# Patient Record
Sex: Female | Born: 1990 | Hispanic: No | Marital: Married | Smoking: Never smoker
Health system: Southern US, Community
[De-identification: ages and names within clinical notes are randomized; demographics above are authoritative.]

## PROBLEM LIST (undated history)

## (undated) ENCOUNTER — Inpatient Hospital Stay (HOSPITAL_COMMUNITY): Payer: Self-pay

## (undated) DIAGNOSIS — J45909 Unspecified asthma, uncomplicated: Secondary | ICD-10-CM

## (undated) DIAGNOSIS — F419 Anxiety disorder, unspecified: Secondary | ICD-10-CM

## (undated) DIAGNOSIS — R519 Headache, unspecified: Secondary | ICD-10-CM

## (undated) DIAGNOSIS — D649 Anemia, unspecified: Secondary | ICD-10-CM

## (undated) DIAGNOSIS — Z8619 Personal history of other infectious and parasitic diseases: Secondary | ICD-10-CM

## (undated) HISTORY — DX: Anxiety disorder, unspecified: F41.9

## (undated) HISTORY — DX: Headache, unspecified: R51.9

## (undated) HISTORY — DX: Anemia, unspecified: D64.9

## (undated) HISTORY — PX: UMBILICAL HERNIA REPAIR: SHX196

## (undated) HISTORY — DX: Personal history of other infectious and parasitic diseases: Z86.19

## (undated) HISTORY — PX: APPENDECTOMY: SHX54

## (undated) HISTORY — PX: TONSILLECTOMY: SUR1361

## (undated) HISTORY — PX: KNEE SURGERY: SHX244

## (undated) HISTORY — DX: Unspecified asthma, uncomplicated: J45.909

---

## 2014-11-02 ENCOUNTER — Ambulatory Visit (INDEPENDENT_AMBULATORY_CARE_PROVIDER_SITE_OTHER): Payer: BLUE CROSS/BLUE SHIELD | Admitting: Obstetrics & Gynecology

## 2014-11-02 ENCOUNTER — Encounter: Payer: Self-pay | Admitting: Obstetrics & Gynecology

## 2014-11-02 VITALS — BP 109/64 | HR 72 | Resp 16 | Ht 66.0 in | Wt 169.0 lb

## 2014-11-02 DIAGNOSIS — G43909 Migraine, unspecified, not intractable, without status migrainosus: Secondary | ICD-10-CM | POA: Insufficient documentation

## 2014-11-02 DIAGNOSIS — G43009 Migraine without aura, not intractable, without status migrainosus: Secondary | ICD-10-CM

## 2014-11-02 DIAGNOSIS — N8320 Unspecified ovarian cysts: Secondary | ICD-10-CM

## 2014-11-02 DIAGNOSIS — N83202 Unspecified ovarian cyst, left side: Secondary | ICD-10-CM

## 2014-11-02 DIAGNOSIS — Z30011 Encounter for initial prescription of contraceptive pills: Secondary | ICD-10-CM

## 2014-11-02 NOTE — Progress Notes (Signed)
Pt is a 24 yo G1P1001 who presents for f/u from ED visit [facility].  Pt had left lower quadrant pain that was not getting better.  She suspected it had something to do with ovulation but pain was more intense than normal.  She can tell which side she develops a follicle on and has mittleschmerz. US was nml with trace ff in the cul de sac and an involuting corpus luteum.  Pt was unaware these findings were normal at the time of her ED visit so she is relieved today to find out her US is nml.  We had an extensive conversation about birth control choices.  WE decided that OCPs would be best to control ovulation and if she did continuous, then this could also help her migraine headaches.  Past Medical History  Diagnosis Date  . Asthma    Family History  Problem Relation Age of Onset  . Hypertension Mother   . Hypertension Father   . Hypertension Maternal Grandmother   . Hypertension Maternal Grandfather   . Hypertension Paternal Grandmother   . Hypertension Paternal Grandfather   . Diabetes Maternal Grandfather   . Cancer - Lung Maternal Grandfather   . Anemia Mother    Pt's history negative for contraindication to OCPs.  Pt would like to discuss with her husband and return for IUD removal and start OCPs. Pt has been on Sprintec in the past and had good cycle control.  30 minutes spent face to face with patient and >50% was counseling.  Onna Nodal H.

## 2015-10-24 NOTE — L&D Delivery Note (Signed)
Delivery Note At 8:18 PM a viable and healthy female was delivered via Vaginal, Spontaneous Delivery (Presentation:ROA ;  ).  APGAR: 9, 10; weight  .   Placenta status: spontaneous, intact not sent, .  Cord:  with the following complications: . Loop next to chest Cord pH: none  Anesthesia:  epidural Episiotomy: None Lacerations: None Suture Repair: none Est. Blood Loss (mL): 150  Mom to postpartum.  Baby to Couplet care / Skin to Skin.  Jj Enyeart A 06/01/2016, 8:39 PM

## 2015-11-12 LAB — OB RESULTS CONSOLE RUBELLA ANTIBODY, IGM: Rubella: IMMUNE

## 2015-11-12 LAB — OB RESULTS CONSOLE ABO/RH: RH Type: POSITIVE

## 2015-11-12 LAB — OB RESULTS CONSOLE RPR: RPR: NONREACTIVE

## 2015-11-12 LAB — OB RESULTS CONSOLE ANTIBODY SCREEN: Antibody Screen: NEGATIVE

## 2015-11-12 LAB — OB RESULTS CONSOLE HIV ANTIBODY (ROUTINE TESTING): HIV: NONREACTIVE

## 2015-11-12 LAB — OB RESULTS CONSOLE GC/CHLAMYDIA
Chlamydia: NEGATIVE
Gonorrhea: NEGATIVE

## 2015-11-12 LAB — OB RESULTS CONSOLE HEPATITIS B SURFACE ANTIGEN: Hepatitis B Surface Ag: NEGATIVE

## 2016-05-09 LAB — OB RESULTS CONSOLE GBS: GBS: POSITIVE

## 2016-05-24 ENCOUNTER — Other Ambulatory Visit: Payer: Self-pay | Admitting: Obstetrics and Gynecology

## 2016-05-28 ENCOUNTER — Encounter (HOSPITAL_COMMUNITY): Payer: Self-pay | Admitting: *Deleted

## 2016-05-28 ENCOUNTER — Inpatient Hospital Stay (HOSPITAL_COMMUNITY)
Admission: AD | Admit: 2016-05-28 | Discharge: 2016-05-28 | Disposition: A | Discharge status: Home / Self Care | Payer: 59 | Source: Ambulatory Visit | Attending: Obstetrics and Gynecology | Admitting: Obstetrics and Gynecology

## 2016-05-28 DIAGNOSIS — Z3A38 38 weeks gestation of pregnancy: Secondary | ICD-10-CM | POA: Insufficient documentation

## 2016-05-28 NOTE — Discharge Instructions (Signed)
Braxton Hicks Contractions °Contractions of the uterus can occur throughout pregnancy. Contractions are not always a sign that you are in labor.  °WHAT ARE BRAXTON HICKS CONTRACTIONS?  °Contractions that occur before labor are called Braxton Hicks contractions, or false labor. Toward the end of pregnancy (32-34 weeks), these contractions can develop more often and may become more forceful. This is not true labor because these contractions do not result in opening (dilatation) and thinning of the cervix. They are sometimes difficult to tell apart from true labor because these contractions can be forceful and people have different pain tolerances. You should not feel embarrassed if you go to the hospital with false labor. Sometimes, the only way to tell if you are in true labor is for your health care provider to look for changes in the cervix. °If there are no prenatal problems or other health problems associated with the pregnancy, it is completely safe to be sent home with false labor and await the onset of true labor. °HOW CAN YOU TELL THE DIFFERENCE BETWEEN TRUE AND FALSE LABOR? °False Labor °· The contractions of false labor are usually shorter and not as hard as those of true labor.   °· The contractions are usually irregular.   °· The contractions are often felt in the front of the lower abdomen and in the groin.   °· The contractions may go away when you walk around or change positions while lying down.   °· The contractions get weaker and are shorter lasting as time goes on.   °· The contractions do not usually become progressively stronger, regular, and closer together as with true labor.   °True Labor °· Contractions in true labor last 30-70 seconds, become very regular, usually become more intense, and increase in frequency.   °· The contractions do not go away with walking.   °· The discomfort is usually felt in the top of the uterus and spreads to the lower abdomen and low back.   °· True labor can be  determined by your health care provider with an exam. This will show that the cervix is dilating and getting thinner.   °WHAT TO REMEMBER °· Keep up with your usual exercises and follow other instructions given by your health care provider.   °· Take medicines as directed by your health care provider.   °· Keep your regular prenatal appointments.   °· Eat and drink lightly if you think you are going into labor.   °· If Braxton Hicks contractions are making you uncomfortable:   °¨ Change your position from lying down or resting to walking, or from walking to resting.   °¨ Sit and rest in a tub of warm water.   °¨ Drink 2-3 glasses of water. Dehydration may cause these contractions.   °¨ Do slow and deep breathing several times an hour.   °WHEN SHOULD I SEEK IMMEDIATE MEDICAL CARE? °Seek immediate medical care if: °· Your contractions become stronger, more regular, and closer together.   °· You have fluid leaking or gushing from your vagina.   °· You have a fever.   °· You pass blood-tinged mucus.   °· You have vaginal bleeding.   °· You have continuous abdominal pain.   °· You have low back pain that you never had before.   °· You feel your baby's head pushing down and causing pelvic pressure.   °· Your baby is not moving as much as it used to.   °  °This information is not intended to replace advice given to you by your health care provider. Make sure you discuss any questions you have with your health care   provider. °  °Document Released: 10/09/2005 Document Revised: 10/14/2013 Document Reviewed: 07/21/2013 °Elsevier Interactive Patient Education ©2016 [employer] °Fetal Movement Counts °Patient Name: __________________________________________________ Patient Due Date: ____________________ °Performing a fetal movement count is highly recommended in high-risk pregnancies, but it is good for every pregnant woman to do. Your health care provider may ask you to start counting fetal movements at 28 weeks of the  pregnancy. Fetal movements often increase: °· After eating a full meal. °· After physical activity. °· After eating or drinking something sweet or cold. °· At rest. °Pay attention to when you feel the baby is most active. This will help you notice a pattern of your baby's sleep and wake cycles and what factors contribute to an increase in fetal movement. It is important to perform a fetal movement count at the same time each day when your baby is normally most active.  °HOW TO COUNT FETAL MOVEMENTS °1. Find a quiet and comfortable area to sit or lie down on your left side. Lying on your left side provides the best blood and oxygen circulation to your baby. °2. Write down the day and time on a sheet of paper or in a journal. °3. Start counting kicks, flutters, swishes, rolls, or jabs in a 2-hour period. You should feel at least 10 movements within 2 hours. °4. If you do not feel 10 movements in 2 hours, wait 2-3 hours and count again. Look for a change in the pattern or not enough counts in 2 hours. °SEEK MEDICAL CARE IF: °· You feel less than 10 counts in 2 hours, tried twice. °· There is no movement in over an hour. °· The pattern is changing or taking longer each day to reach 10 counts in 2 hours. °· You feel the baby is not moving as he or she usually does. °Date: ____________ Movements: ____________ Start time: ____________ Finish time: ____________  °Date: ____________ Movements: ____________ Start time: ____________ Finish time: ____________ °Date: ____________ Movements: ____________ Start time: ____________ Finish time: ____________ °Date: ____________ Movements: ____________ Start time: ____________ Finish time: ____________ °Date: ____________ Movements: ____________ Start time: ____________ Finish time: ____________ °Date: ____________ Movements: ____________ Start time: ____________ Finish time: ____________ °Date: ____________ Movements: ____________ Start time: ____________ Finish time:  ____________ °Date: ____________ Movements: ____________ Start time: ____________ Finish time: ____________  °Date: ____________ Movements: ____________ Start time: ____________ Finish time: ____________ °Date: ____________ Movements: ____________ Start time: ____________ Finish time: ____________ °Date: ____________ Movements: ____________ Start time: ____________ Finish time: ____________ °Date: ____________ Movements: ____________ Start time: ____________ Finish time: ____________ °Date: ____________ Movements: ____________ Start time: ____________ Finish time: ____________ °Date: ____________ Movements: ____________ Start time: ____________ Finish time: ____________ °Date: ____________ Movements: ____________ Start time: ____________ Finish time: ____________  °Date: ____________ Movements: ____________ Start time: ____________ Finish time: ____________ °Date: ____________ Movements: ____________ Start time: ____________ Finish time: ____________ °Date: ____________ Movements: ____________ Start time: ____________ Finish time: ____________ °Date: ____________ Movements: ____________ Start time: ____________ Finish time: ____________ °Date: ____________ Movements: ____________ Start time: ____________ Finish time: ____________ °Date: ____________ Movements: ____________ Start time: ____________ Finish time: ____________ °Date: ____________ Movements: ____________ Start time: ____________ Finish time: ____________  °Date: ____________ Movements: ____________ Start time: ____________ Finish time: ____________ °Date: ____________ Movements: ____________ Start time: ____________ Finish time: ____________ °Date: ____________ Movements: ____________ Start time: ____________ Finish time: ____________ °Date: ____________ Movements: ____________ Start time: ____________ Finish time: ____________ °Date: ____________ Movements: ____________ Start time: ____________ Finish time: ____________ °Date: ____________ Movements:  ____________ Start time: ____________ Finish time:   ____________ °Date: ____________ Movements: ____________ Start time: ____________ Finish time: ____________  °Date: ____________ Movements: ____________ Start time: ____________ Finish time: ____________ °Date: ____________ Movements: ____________ Start time: ____________ Finish time: ____________ °Date: ____________ Movements: ____________ Start time: ____________ Finish time: ____________ °Date: ____________ Movements: ____________ Start time: ____________ Finish time: ____________ °Date: ____________ Movements: ____________ Start time: ____________ Finish time: ____________ °Date: ____________ Movements: ____________ Start time: ____________ Finish time: ____________ °Date: ____________ Movements: ____________ Start time: ____________ Finish time: ____________  °Date: ____________ Movements: ____________ Start time: ____________ Finish time: ____________ °Date: ____________ Movements: ____________ Start time: ____________ Finish time: ____________ °Date: ____________ Movements: ____________ Start time: ____________ Finish time: ____________ °Date: ____________ Movements: ____________ Start time: ____________ Finish time: ____________ °Date: ____________ Movements: ____________ Start time: ____________ Finish time: ____________ °Date: ____________ Movements: ____________ Start time: ____________ Finish time: ____________ °Date: ____________ Movements: ____________ Start time: ____________ Finish time: ____________  °Date: ____________ Movements: ____________ Start time: ____________ Finish time: ____________ °Date: ____________ Movements: ____________ Start time: ____________ Finish time: ____________ °Date: ____________ Movements: ____________ Start time: ____________ Finish time: ____________ °Date: ____________ Movements: ____________ Start time: ____________ Finish time: ____________ °Date: ____________ Movements: ____________ Start time: ____________ Finish  time: ____________ °Date: ____________ Movements: ____________ Start time: ____________ Finish time: ____________ °Date: ____________ Movements: ____________ Start time: ____________ Finish time: ____________  °Date: ____________ Movements: ____________ Start time: ____________ Finish time: ____________ °Date: ____________ Movements: ____________ Start time: ____________ Finish time: ____________ °Date: ____________ Movements: ____________ Start time: ____________ Finish time: ____________ °Date: ____________ Movements: ____________ Start time: ____________ Finish time: ____________ °Date: ____________ Movements: ____________ Start time: ____________ Finish time: ____________ °Date: ____________ Movements: ____________ Start time: ____________ Finish time: ____________ °  °This information is not intended to replace advice given to you by your health care provider. Make sure you discuss any questions you have with your health care provider. °  °Document Released: 11/08/2006 Document Revised: 10/30/2014 Document Reviewed: 08/05/2012 °Elsevier Interactive Patient Education ©2016 Elsevier Inc. ° °

## 2016-05-28 NOTE — MAU Note (Signed)
Pt has had contractions since last night, feeling them in her back.  Gotten worse so she came in to be checked.

## 2016-05-29 ENCOUNTER — Encounter (HOSPITAL_COMMUNITY): Payer: Self-pay | Admitting: *Deleted

## 2016-05-29 ENCOUNTER — Telehealth (HOSPITAL_COMMUNITY): Payer: Self-pay | Admitting: *Deleted

## 2016-05-29 NOTE — Telephone Encounter (Signed)
Preadmission screen  

## 2016-06-01 ENCOUNTER — Inpatient Hospital Stay (HOSPITAL_COMMUNITY): Payer: 59 | Admitting: Anesthesiology

## 2016-06-01 ENCOUNTER — Encounter (HOSPITAL_COMMUNITY): Payer: Self-pay

## 2016-06-01 ENCOUNTER — Inpatient Hospital Stay (HOSPITAL_COMMUNITY)
Admission: RE | Admit: 2016-06-01 | Discharge: 2016-06-02 | DRG: 775 | Disposition: A | Discharge status: Home / Self Care | Payer: 59 | Source: Ambulatory Visit | Attending: Obstetrics and Gynecology | Admitting: Obstetrics and Gynecology

## 2016-06-01 DIAGNOSIS — O99824 Streptococcus B carrier state complicating childbirth: Secondary | ICD-10-CM | POA: Diagnosis present

## 2016-06-01 DIAGNOSIS — J45909 Unspecified asthma, uncomplicated: Secondary | ICD-10-CM | POA: Diagnosis present

## 2016-06-01 DIAGNOSIS — Z833 Family history of diabetes mellitus: Secondary | ICD-10-CM | POA: Diagnosis not present

## 2016-06-01 DIAGNOSIS — Z3A39 39 weeks gestation of pregnancy: Secondary | ICD-10-CM

## 2016-06-01 DIAGNOSIS — Z3483 Encounter for supervision of other normal pregnancy, third trimester: Secondary | ICD-10-CM | POA: Diagnosis present

## 2016-06-01 DIAGNOSIS — Z8249 Family history of ischemic heart disease and other diseases of the circulatory system: Secondary | ICD-10-CM

## 2016-06-01 DIAGNOSIS — O9952 Diseases of the respiratory system complicating childbirth: Secondary | ICD-10-CM | POA: Diagnosis present

## 2016-06-01 DIAGNOSIS — Z641 Problems related to multiparity: Secondary | ICD-10-CM

## 2016-06-01 LAB — CBC
HCT: 34.7 % — ABNORMAL LOW (ref 36.0–46.0)
Hemoglobin: 12.1 g/dL (ref 12.0–15.0)
MCH: 30.3 pg (ref 26.0–34.0)
MCHC: 34.9 g/dL (ref 30.0–36.0)
MCV: 86.8 fL (ref 78.0–100.0)
Platelets: 234 10*3/uL (ref 150–400)
RBC: 4 MIL/uL (ref 3.87–5.11)
RDW: 13.9 % (ref 11.5–15.5)
WBC: 8 10*3/uL (ref 4.0–10.5)

## 2016-06-01 LAB — RPR: RPR Ser Ql: NONREACTIVE

## 2016-06-01 MED ORDER — LIDOCAINE HCL (PF) 1 % IJ SOLN
30.0000 mL | INTRAMUSCULAR | Status: DC | PRN
  Filled 2016-06-01: qty 30

## 2016-06-01 MED ORDER — SOD CITRATE-CITRIC ACID 500-334 MG/5ML PO SOLN: 30.0000 mL | ORAL | Status: DC | PRN

## 2016-06-01 MED ORDER — SENNOSIDES-DOCUSATE SODIUM 8.6-50 MG PO TABS
2.0000 | ORAL_TABLET | ORAL | Status: DC
  Administered 2016-06-01: 2 via ORAL
  Filled 2016-06-01: qty 2

## 2016-06-01 MED ORDER — MISOPROSTOL 25 MCG QUARTER TABLET
25.0000 ug | ORAL_TABLET | ORAL | Status: DC | PRN
  Administered 2016-06-01 (×2): 25 ug via ORAL
  Filled 2016-06-01 (×2): qty 0.25
  Filled 2016-06-01: qty 1

## 2016-06-01 MED ORDER — OXYTOCIN 40 UNITS IN LACTATED RINGERS INFUSION - SIMPLE MED
1.0000 m[IU]/min | INTRAVENOUS | Status: DC
  Administered 2016-06-01: 2 m[IU]/min via INTRAVENOUS
  Administered 2016-06-01: 4 m[IU]/min via INTRAVENOUS
  Administered 2016-06-01: 6 m[IU]/min via INTRAVENOUS
  Filled 2016-06-01: qty 1000

## 2016-06-01 MED ORDER — ONDANSETRON HCL 4 MG/2ML IJ SOLN: 4.0000 mg | Freq: Four times a day (QID) | INTRAMUSCULAR | Status: DC | PRN

## 2016-06-01 MED ORDER — OXYTOCIN 10 UNIT/ML IJ SOLN: 10.0000 [IU] | Freq: Once | INTRAMUSCULAR | Status: DC

## 2016-06-01 MED ORDER — ONDANSETRON HCL 4 MG PO TABS: 4.0000 mg | ORAL_TABLET | ORAL | Status: DC | PRN

## 2016-06-01 MED ORDER — IBUPROFEN 600 MG PO TABS
600.0000 mg | ORAL_TABLET | Freq: Four times a day (QID) | ORAL | Status: DC
  Administered 2016-06-01 – 2016-06-02 (×4): 600 mg via ORAL
  Filled 2016-06-01 (×4): qty 1

## 2016-06-01 MED ORDER — WITCH HAZEL-GLYCERIN EX PADS: 1.0000 "application " | MEDICATED_PAD | CUTANEOUS | Status: DC | PRN

## 2016-06-01 MED ORDER — ZOLPIDEM TARTRATE 5 MG PO TABS: 5.0000 mg | ORAL_TABLET | Freq: Every evening | ORAL | Status: DC | PRN

## 2016-06-01 MED ORDER — PRENATAL MULTIVITAMIN CH
1.0000 | ORAL_TABLET | Freq: Every day | ORAL | Status: DC
  Administered 2016-06-02: 1 via ORAL
  Filled 2016-06-01: qty 1

## 2016-06-01 MED ORDER — LACTATED RINGERS IV SOLN
500.0000 mL | Freq: Once | INTRAVENOUS | Status: AC
  Administered 2016-06-01: 500 mL via INTRAVENOUS

## 2016-06-01 MED ORDER — DIPHENHYDRAMINE HCL 50 MG/ML IJ SOLN: 12.5000 mg | INTRAMUSCULAR | Status: DC | PRN

## 2016-06-01 MED ORDER — OXYCODONE-ACETAMINOPHEN 5-325 MG PO TABS: 2.0000 | ORAL_TABLET | ORAL | Status: DC | PRN

## 2016-06-01 MED ORDER — FENTANYL 2.5 MCG/ML BUPIVACAINE 1/10 % EPIDURAL INFUSION (WH - ANES)
14.0000 mL/h | INTRAMUSCULAR | Status: DC | PRN
  Administered 2016-06-01: 14 mL/h via EPIDURAL
  Filled 2016-06-01: qty 125

## 2016-06-01 MED ORDER — LACTATED RINGERS IV SOLN: 500.0000 mL | Freq: Once | INTRAVENOUS | Status: DC

## 2016-06-01 MED ORDER — CLINDAMYCIN PHOSPHATE 900 MG/50ML IV SOLN: 900.0000 mg | Freq: Three times a day (TID) | INTRAVENOUS | Status: DC

## 2016-06-01 MED ORDER — FERROUS SULFATE 325 (65 FE) MG PO TABS
325.0000 mg | ORAL_TABLET | Freq: Two times a day (BID) | ORAL | Status: DC
  Administered 2016-06-02 (×2): 325 mg via ORAL
  Filled 2016-06-01 (×2): qty 1

## 2016-06-01 MED ORDER — DIPHENHYDRAMINE HCL 25 MG PO CAPS: 25.0000 mg | ORAL_CAPSULE | Freq: Four times a day (QID) | ORAL | Status: DC | PRN

## 2016-06-01 MED ORDER — EPHEDRINE 5 MG/ML INJ
10.0000 mg | INTRAVENOUS | Status: DC | PRN
Start: 2016-06-01 — End: 2016-06-01
  Filled 2016-06-01: qty 4

## 2016-06-01 MED ORDER — ACETAMINOPHEN 325 MG PO TABS
650.0000 mg | ORAL_TABLET | ORAL | Status: DC | PRN
  Administered 2016-06-02 (×2): 650 mg via ORAL
  Filled 2016-06-01 (×2): qty 2

## 2016-06-01 MED ORDER — TERBUTALINE SULFATE 1 MG/ML IJ SOLN
0.2500 mg | Freq: Once | INTRAMUSCULAR | Status: DC | PRN
  Filled 2016-06-01: qty 1

## 2016-06-01 MED ORDER — OXYCODONE-ACETAMINOPHEN 5-325 MG PO TABS: 1.0000 | ORAL_TABLET | ORAL | Status: DC | PRN

## 2016-06-01 MED ORDER — OXYTOCIN 40 UNITS IN LACTATED RINGERS INFUSION - SIMPLE MED: 2.5000 [IU]/h | INTRAVENOUS | Status: DC

## 2016-06-01 MED ORDER — PHENYLEPHRINE 40 MCG/ML (10ML) SYRINGE FOR IV PUSH (FOR BLOOD PRESSURE SUPPORT)
80.0000 ug | PREFILLED_SYRINGE | INTRAVENOUS | Status: DC | PRN
  Filled 2016-06-01: qty 5

## 2016-06-01 MED ORDER — EPHEDRINE 5 MG/ML INJ
10.0000 mg | INTRAVENOUS | Status: DC | PRN
  Filled 2016-06-01: qty 4

## 2016-06-01 MED ORDER — DIBUCAINE 1 % RE OINT: 1.0000 "application " | TOPICAL_OINTMENT | RECTAL | Status: DC | PRN

## 2016-06-01 MED ORDER — LIDOCAINE HCL (PF) 1 % IJ SOLN
INTRAMUSCULAR | Status: DC | PRN
  Administered 2016-06-01: 2 mL via EPIDURAL
  Administered 2016-06-01: 5 mL via EPIDURAL
  Administered 2016-06-01: 3 mL via EPIDURAL

## 2016-06-01 MED ORDER — ONDANSETRON HCL 4 MG/2ML IJ SOLN: 4.0000 mg | INTRAMUSCULAR | Status: DC | PRN

## 2016-06-01 MED ORDER — PHENYLEPHRINE 40 MCG/ML (10ML) SYRINGE FOR IV PUSH (FOR BLOOD PRESSURE SUPPORT)
80.0000 ug | PREFILLED_SYRINGE | INTRAVENOUS | Status: DC | PRN
Start: 2016-06-01 — End: 2016-06-01
  Filled 2016-06-01: qty 10
  Filled 2016-06-01: qty 5

## 2016-06-01 MED ORDER — CLINDAMYCIN PHOSPHATE 900 MG/50ML IV SOLN
900.0000 mg | Freq: Three times a day (TID) | INTRAVENOUS | Status: DC
  Administered 2016-06-01 (×2): 900 mg via INTRAVENOUS
  Filled 2016-06-01 (×3): qty 50

## 2016-06-01 MED ORDER — OXYTOCIN BOLUS FROM INFUSION: 500.0000 mL | Freq: Once | INTRAVENOUS | Status: DC

## 2016-06-01 MED ORDER — SIMETHICONE 80 MG PO CHEW: 80.0000 mg | CHEWABLE_TABLET | ORAL | Status: DC | PRN

## 2016-06-01 MED ORDER — LACTATED RINGERS IV SOLN
INTRAVENOUS | Status: DC
  Administered 2016-06-01 (×2): via INTRAVENOUS

## 2016-06-01 MED ORDER — COCONUT OIL OIL: 1.0000 "application " | TOPICAL_OIL | Status: DC | PRN

## 2016-06-01 MED ORDER — ACETAMINOPHEN 325 MG PO TABS: 650.0000 mg | ORAL_TABLET | ORAL | Status: DC | PRN

## 2016-06-01 MED ORDER — LACTATED RINGERS IV SOLN: 500.0000 mL | INTRAVENOUS | Status: DC | PRN

## 2016-06-01 MED ORDER — BENZOCAINE-MENTHOL 20-0.5 % EX AERO
1.0000 "application " | INHALATION_SPRAY | CUTANEOUS | Status: DC | PRN
  Administered 2016-06-02: 1 via TOPICAL
  Filled 2016-06-01: qty 56

## 2016-06-01 NOTE — Progress Notes (Signed)
S: notes some painful ctx S/p cytotec x 2  O; VE deferred Tracing: baseline 135 (+) accels Ctx q 2-594mins  IMP: Term GBS cx (+) on IV clindamycin P)  Cont with present mgmt. Will start pitocin if unable to do next cytotec Analgesic prn

## 2016-06-01 NOTE — Anesthesia Procedure Notes (Signed)
Epidural Patient location during procedure: OB  Staffing Anesthesiologist: Cecile HearingURK, Jakyron Fabro EDWARD Performed: anesthesiologist   Preanesthetic Checklist Completed: patient identified, pre-op evaluation, timeout performed, IV checked, risks and benefits discussed and monitors and equipment checked  Epidural Patient position: sitting Prep: DuraPrep Patient monitoring: blood pressure and continuous pulse ox Approach: midline Location: L3-L4 Injection technique: LOR air  Needle:  Needle type: Tuohy  Needle gauge: 17 G Needle length: 9 cm Needle insertion depth: 5 cm Catheter size: 19 Gauge Catheter at skin depth: 9 cm Test dose: negative and Other (1% Lidocaine)  Additional Notes Patient identified.  Risk benefits discussed including failed block, incomplete pain control, headache, nerve damage, paralysis, blood pressure changes, nausea, vomiting, reactions to medication both toxic or allergic, and postpartum back pain.  Patient expressed understanding and wished to proceed.  All questions were answered.  Sterile technique used throughout procedure and epidural site dressed with sterile barrier dressing. No paresthesia or other complications noted. The patient did not experience any signs of intravascular injection such as tinnitus or metallic taste in mouth nor signs of intrathecal spread such as rapid motor block. Please see nursing notes for vital signs. Reason for block:procedure for pain

## 2016-06-01 NOTE — Anesthesia Preprocedure Evaluation (Signed)
Anesthesia Evaluation  Patient identified by MRN, date of birth, ID band Patient awake    Reviewed: Allergy & Precautions, NPO status , Patient's Chart, lab work & pertinent test results  Airway Mallampati: III  TM Distance: >3 FB Neck ROM: Full    Dental  (+) Teeth Intact, Dental Advisory Given   Pulmonary asthma ,    Pulmonary exam normal breath sounds clear to auscultation       Cardiovascular Exercise Tolerance: Good negative cardio ROS Normal cardiovascular exam Rhythm:Regular Rate:Normal     Neuro/Psych  Headaches, negative psych ROS   GI/Hepatic negative GI ROS, Neg liver ROS,   Endo/Other  negative endocrine ROS  Renal/GU negative Renal ROS     Musculoskeletal negative musculoskeletal ROS (+)   Abdominal   Peds  Hematology negative hematology ROS (+)   Anesthesia Other Findings Day of surgery medications reviewed with the patient.  Reproductive/Obstetrics (+) Pregnancy                             Anesthesia Physical Anesthesia Plan  ASA: II  Anesthesia Plan: Epidural   Post-op Pain Management:    Induction:   Airway Management Planned:   Additional Equipment:   Intra-op Plan:   Post-operative Plan:   Informed Consent: I have reviewed the patients History and Physical, chart, labs and discussed the procedure including the risks, benefits and alternatives for the proposed anesthesia with the patient or authorized representative who has indicated his/her understanding and acceptance.   Dental advisory given  Plan Discussed with:   Anesthesia Plan Comments: (Patient identified. Risks/Benefits/Options discussed with patient including but not limited to bleeding, infection, nerve damage, paralysis, failed block, incomplete pain control, headache, blood pressure changes, nausea, vomiting, reactions to medication both or allergic, itching and postpartum back pain. Confirmed  with bedside nurse the patient's most recent platelet count. Confirmed with patient that they are not currently taking any anticoagulation, have any bleeding history or any family history of bleeding disorders. Patient expressed understanding and wished to proceed. All questions were answered. )        Anesthesia Quick Evaluation

## 2016-06-01 NOTE — Anesthesia Pain Management Evaluation Note (Signed)
  CRNA Pain Management Visit Note  Patient: Kimberly Rivera, 25 y.o., female  "Hello I am a [facility] of the anesthesia team at [facility]. We have an anesthesia team available at all times to provide care throughout the hospital, including epidural management and anesthesia for C-section. I don't know your plan for the delivery whether it a natural birth, water birth, IV sedation, nitrous supplementation, doula or epidural, but we want to meet your pain goals."   1.Was your pain managed to your expectations on prior hospitalizations?   Yes   2.What is your expectation for pain management during this hospitalization?     Epidural  3.How can we help you reach that goal? epidural  Record the patient's initial score and the patient's pain goal.   Pain: 4  Pain Goal: 6 [facility] wants you to be able to say your pain was always managed very well.  [name],[name] 06/01/2016

## 2016-06-01 NOTE — H&P (Signed)
Nari Yetta BarreJones is a 25 y.o. female presenting for IOL 2nd to multiparity and social situation. PNC uncomplicated. (+) GBS  OB History    Gravida Para Term Preterm AB Living   2 1 1     1    SAB TAB Ectopic Multiple Live Births                 Past Medical History:  Diagnosis Date  . Asthma   . Hx of varicella    Past Surgical History:  Procedure Laterality Date  . APPENDECTOMY    . KNEE SURGERY Right   . UMBILICAL HERNIA REPAIR     Family History: family history includes Anemia in her mother; Cancer - Lung in her maternal grandfather; Diabetes in her maternal grandfather; Heart attack in her maternal uncle; Hypertension in her father, maternal grandfather, maternal grandmother, mother, paternal grandfather, and paternal grandmother. Social History:  reports that she has never smoked. She has never used smokeless tobacco. She reports that she does not drink alcohol or use drugs.     Maternal Diabetes: No Genetic Screening: Declined Maternal Ultrasounds/Referrals: Normal Fetal Ultrasounds or other Referrals:  None Maternal Substance Abuse:  No Significant Maternal Medications:  None Significant Maternal Lab Results:  Lab values include: Group B Strep positive Other Comments:  None  Review of Systems  All other systems reviewed and are negative.  History   Blood pressure 120/66, pulse (!) 103, temperature 98.8 F (37.1 C), temperature source Oral. Exam Physical Exam  Constitutional: She is oriented to person, place, and time. She appears well-developed and well-nourished.  HENT:  Head: Atraumatic.  Eyes: EOM are normal.  Neck: Neck supple.  Cardiovascular: Regular rhythm.   GI: Soft.  Musculoskeletal: She exhibits no edema.  Neurological: She is alert and oriented to person, place, and time.  Skin: Skin is warm.  Psychiatric: She has a normal mood and affect.    Prenatal labs: ABO, Rh: B/Positive/-- (01/20 0000) Antibody: Negative (01/20 0000) Rubella: Immune (01/20  0000) RPR: Nonreactive (01/20 0000)  HBsAg: Negative (01/20 0000)  HIV: Non-reactive (01/20 0000)  GBS: Positive (07/18 0000)   Assessment/Plan: Term Multiparity P) admit routine labs. Oral cytotec induction. Analgesic prn   Hailey Stormer A 06/01/2016, 7:57 AM

## 2016-06-01 NOTE — Progress Notes (Signed)
S; comfortable Epidural  O: pitocin VE 5/80/+1 Palp fingers on maternal right  AROM clear fluid IUPC placed Tracing: baseline 130 (+) accels Ctx q 2 mins   IMP: active phase labor Term GBS cx (+)  On Clindamycin P) exaggerated left with peanut ball. Cont pitocin

## 2016-06-02 ENCOUNTER — Encounter (HOSPITAL_COMMUNITY): Payer: Self-pay

## 2016-06-02 LAB — CBC
HCT: 35.6 % — ABNORMAL LOW (ref 36.0–46.0)
Hemoglobin: 12.4 g/dL (ref 12.0–15.0)
MCH: 30 pg (ref 26.0–34.0)
MCHC: 34.8 g/dL (ref 30.0–36.0)
MCV: 86.2 fL (ref 78.0–100.0)
Platelets: 192 10*3/uL (ref 150–400)
RBC: 4.13 MIL/uL (ref 3.87–5.11)
RDW: 14 % (ref 11.5–15.5)
WBC: 10.7 10*3/uL — ABNORMAL HIGH (ref 4.0–10.5)

## 2016-06-02 MED ORDER — COCONUT OIL OIL: 1.0000 "application " | TOPICAL_OIL | 0 refills | Status: DC | PRN

## 2016-06-02 MED ORDER — IBUPROFEN 600 MG PO TABS: 600.0000 mg | ORAL_TABLET | Freq: Four times a day (QID) | ORAL | 0 refills | Status: DC

## 2016-06-02 NOTE — Lactation Note (Signed)
This note was copied from a baby's chart. Lactation Consultation Note Experience BF mom Bf her now 25 yr old for 2 1/2 yrs. States this baby is Bf well. FOB is doing STS. Educated about newborn behavior, STS, I&O, supply and demand. WH/LC brochure given w/resources, support groups and LC services. Encouraged to call for questions or concerns. Patient Name: Kimberly Rivera Today's Date: 06/02/2016 Reason for consult: Initial assessment   Maternal Data Has patient been taught Hand Expression?: Yes Does the patient have breastfeeding experience prior to this delivery?: Yes  Feeding Feeding Type: Breast Fed Length of feed: 20 min  LATCH Score/Interventions                      Lactation Tools Discussed/Used WIC Program: No   Consult Status Consult Status: PRN Date: 06/02/16 Follow-up type: In-patient    Charyl DancerCARVER, Ferdie Bakken G 06/02/2016, 4:51 AM

## 2016-06-02 NOTE — Progress Notes (Signed)
Patient ID: Kizzie FurnishIris Rivera, female   DOB: [birth year], 25 y.o.   MRN: [id number] PPD # 1 SVD  S:  Reports feeling well. Desires an early d/c home, "if ok with WOB and pediatrician".             Tolerating po/ No nausea or vomiting             Bleeding is light             Pain controlled with ibuprofen (OTC)             Up ad lib / ambulatory / voiding without difficulties    Newborn  Information for the patient's newborn:  [name], [name] [[id number]][[id number]]  female  breast feeding "[name]"   O:  A & O x 3, in no apparent distress              VS:  Vitals:   06/01/16 2147 06/01/16 2202 06/01/16 2215 06/01/16 2315  BP: 103/68 (!) 119/51 (!) 107/54 (!) 111/59  Pulse: 92 92 76 73  Resp: 16 16 18 18   Temp:   98.5 F (36.9 C) 98.7 F (37.1 C)  TempSrc:   Oral Oral  SpO2:   100% 97%  Weight:      Height:        LABS:  Recent Labs  06/01/16 0748 06/02/16 0612  WBC 8.0 10.7*  HGB 12.1 12.4  HCT 34.7* 35.6*  PLT 234 192    Blood type: B/Positive/-- (01/20 0000)  Rubella: Immune (01/20 0000)   I&O: I/O last 3 completed shifts: In: -  Out: 1050 [Urine:900; Blood:150]          No intake/output data recorded.  Lungs: Clear and unlabored  Heart: regular rate and rhythm / no murmurs  Abdomen: soft, non-tender, non-distended             Fundus: firm, non-tender, U-even  Perineum: Intact, no edema  Lochia: minimal  Extremities: No edema, no calf pain or tenderness, No Homans    A/P: PPD # 1 25 y.o., [id number]   Principal Problem:    Postpartum care following vaginal delivery (8/10)  Active Problems:    Multiparity   Doing well - stable status  Routine post partum orders  Ok for early d/c home today    Khadeejah Castner, M, MSN, CNM 06/02/2016, 9:22 AM

## 2016-06-02 NOTE — Anesthesia Postprocedure Evaluation (Signed)
Anesthesia Post Note  Patient: Kimberly FurnishIris Rivera  Procedure(s) Performed: * No procedures listed *  Patient location during evaluation: Mother Baby Anesthesia Type: Epidural Level of consciousness: awake, awake and alert, oriented and patient cooperative Pain management: pain level controlled Vital Signs Assessment: post-procedure vital signs reviewed and stable Respiratory status: spontaneous breathing, nonlabored ventilation and respiratory function stable Cardiovascular status: stable Postop Assessment: no headache, no backache, no signs of nausea or vomiting and patient able to bend at knees Anesthetic complications: no     Last Vitals:  Vitals:   06/01/16 2215 06/01/16 2315  BP: (!) 107/54 (!) 111/59  Pulse: 76 73  Resp: 18 18  Temp: 36.9 C 37.1 C    Last Pain:  Vitals:   06/02/16 0506  TempSrc:   PainSc: 0-No pain   Pain Goal: Patients Stated Pain Goal: 6 (06/01/16 1216)               Keghan Mcfarren L

## 2016-06-02 NOTE — Discharge Summary (Signed)
OB Discharge Summary     Patient Name: Kimberly Rivera DOB: 1990/10/28 MRN: [id number]  Date of admission: 06/01/2016 Delivering MD: Normon Pettijohn   Date of discharge: 06/02/2016  Admitting diagnosis: multiparity, term gestation Intrauterine pregnancy: 6429w1d     Secondary diagnosis:  Principal Problem:   Postpartum care following vaginal delivery (8/10) Active Problems:   Multiparity  Additional problems: none     Discharge diagnosis: Term Pregnancy Delivered                                                                                                Post partum procedures:none  Augmentation: AROM, Pitocin and Cytotec( oral0  Complications: None  Hospital course:  Induction of Labor With Vaginal Delivery   25 y.o. yo [id number] at 5429w1d was admitted to the hospital 06/01/2016 for induction of labor.  Indication for induction: elective d/t multiparity and social situation.  Patient had an uncomplicated labor course as follows: Membrane Rupture Time/Date: 7:28 PM ,06/01/2016   Intrapartum Procedures: Episiotomy: None [1]                                         Lacerations:  None [1]  Patient had delivery of a Viable infant.  Information for the patient's newborn:  [name], Girl Loreda [[id number]][[id number]]  Delivery Method: Vaginal, Spontaneous Delivery (Filed from Delivery Summary)   06/01/2016  Details of delivery can be found in separate delivery note.  Patient had a routine postpartum course. Patient is discharged home 06/02/16.   Physical exam Vitals:   06/01/16 2147 06/01/16 2202 06/01/16 2215 06/01/16 2315  BP: 103/68 (!) 119/51 (!) 107/54 (!) 111/59  Pulse: 92 92 76 73  Resp: 16 16 18 18   Temp:   98.5 F (36.9 C) 98.7 F (37.1 C)  TempSrc:   Oral Oral  SpO2:   100% 97%  Weight:      Height:       General: alert, cooperative and no distress Lochia: appropriate Uterine Fundus: firm  DVT Evaluation: No evidence of DVT seen on physical exam. Negative Homan's sign. No  cords or calf tenderness. No significant calf/ankle edema. Labs: Lab Results  Component Value Date   WBC 10.7 (H) 06/02/2016   HGB 12.4 06/02/2016   HCT 35.6 (L) 06/02/2016   MCV 86.2 06/02/2016   PLT 192 06/02/2016   No flowsheet data found.  Discharge instruction: per After Visit Summary and "Baby and Me Booklet".  After visit meds:    Medication List    TAKE these medications   coconut oil Oil Apply 1 application topically as needed.   ibuprofen 600 MG tablet Commonly known as:  ADVIL,MOTRIN Take 1 tablet (600 mg total) by mouth every 6 (six) hours.   prenatal multivitamin Tabs tablet Take 1 tablet by mouth daily at 12 noon.   ranitidine 150 MG tablet Commonly known as:  ZANTAC Take 150 mg by mouth at bedtime.       Diet: routine diet  Activity: Advance as tolerated.  Pelvic rest for 6 weeks.   Outpatient follow up:6 weeks Follow up Appt:No future appointments. Follow up Visit:No Follow-up on file.  Postpartum contraception: Undecided  Newborn Data: Live born female on 06/01/2016 Birth Weight: 7 lb 5.1 oz (3320 g) APGAR: 9, 10  Baby Feeding: Breast Disposition:home with mother   06/02/2016 Raelyn Mora, Judie Petit, CNM

## 2016-06-02 NOTE — Discharge Instructions (Signed)
Breast Pumping Tips °If you are breastfeeding, there may be times when you cannot feed your baby directly. Returning to work or going on a trip are common examples. Pumping allows you to store breast milk and feed it to your baby later.  °You may not get much milk when you first start to pump. Your breasts should start to make more after a few days. If you pump at the times you usually feed your baby, you may be able to keep making enough milk to feed your baby without also using formula. The more often you pump, the more milk you will produce.  °WHEN SHOULD I PUMP?  °· You can begin to pump soon after delivery. However, some experts recommend waiting about 4 weeks before giving your infant a bottle to make sure breastfeeding is going well.  °· If you plan to return to work, begin pumping a few weeks before. This will help you develop techniques that work best for you. It also lets you build up a supply of breast milk.   °· When you are with your infant, feed on demand and pump after each feeding.   °· When you are away from your infant for several hours, pump for about 15 minutes every 2-3 hours. Pump both breasts at the same time if you can.   °· If your infant has a formula feeding, make sure to pump around the same time.     °· If you drink any alcohol, wait 2 hours before pumping.   °HOW DO I PREPARE TO PUMP? °Your let-down reflex is the natural reaction to stimulation that makes your breast milk flow. It is easier to stimulate this reflex when you are relaxed. Find relaxation techniques that work for you. If you have difficulty with your let-down reflex, try these methods:  °· Smell one of your infant's blankets or an item of clothing.   °· Look at a picture or video of your infant.   °· Sit in a quiet, private space.   °· Massage the breast you plan to pump.   °· Place soothing warmth on the breast.   °· Play relaxing music.   °WHAT ARE SOME GENERAL BREAST PUMPING TIPS? °· Wash your hands before you pump. You  do not need to wash your nipples or breasts. °· There are three ways to pump. °¨ You can use your hand to massage and compress your breast. °¨ You can use a handheld manual pump. °¨ You can use an electric pump.   °· Make sure the suction cup (flange) on the breast pump is the right size. Place the flange directly over the nipple. If it is the wrong size or placed the wrong way, it may be painful and cause nipple damage.   °· If pumping is uncomfortable, apply a small amount of purified or modified lanolin to your nipple and areola. °· If you are using an electric pump, adjust the speed and suction power to be more comfortable. °· If pumping is painful or if you are not getting very much milk, you may need a different type of pump. A lactation consultant can help you determine what type of pump to use.   °· Keep a full water bottle near you at all times. Drinking lots of fluid helps you make more milk.  °· You can store your milk to use later. Pumped breast milk can be stored in a sealable, sterile container or plastic bag. Label all stored breast milk with the date you pumped it. °¨ Milk can stay out at room temperature for up to 8 hours. °¨   You can store your milk in the refrigerator for up to 8 days. °¨ You can store your milk in the freezer for 3 months. Thaw frozen milk using warm water. Do not put it in the microwave. °· Do not smoke. Smoking can lower your milk supply and harm your infant. If you need help quitting, ask your health care provider to recommend a program.   °WHEN SHOULD I CALL MY HEALTH CARE PROVIDER OR A LACTATION CONSULTANT? °· You are having trouble pumping. °· You are concerned that you are not making enough milk. °· You have nipple pain, soreness, or redness. °· You want to use birth control. Birth control pills may lower your milk supply. Talk to your health care provider about your options. °  °This information is not intended to replace advice given to you by your health care provider.  Make sure you discuss any questions you have with your health care provider. °  °Document Released: 03/29/2010 Document Revised: 10/14/2013 Document Reviewed: 08/01/2013 °[employer]vier Interactive Patient Education ©2016 [employer] °Postpartum Depression and Baby Blues °The postpartum period begins right after the birth of a baby. During this time, there is often a great amount of joy and excitement. It is also a time of many changes in the life of the parents. Regardless of how many times a mother gives birth, each child brings new challenges and dynamics to the family. It is not unusual to have feelings of excitement along with confusing shifts in moods, emotions, and thoughts. All mothers are at risk of developing postpartum depression or the "baby blues." These mood changes can occur right after giving birth, or they may occur many months after giving birth. The baby blues or postpartum depression can be mild or severe. Additionally, postpartum depression can go away rather quickly, or it can be a long-term condition.  °CAUSES °Raised hormone levels and the rapid drop in those levels are thought to be a main cause of postpartum depression and the baby blues. A number of hormones change during and after pregnancy. Estrogen and progesterone usually decrease right after the delivery of your baby. The levels of thyroid hormone and various cortisol steroids also rapidly drop. Other factors that play a role in these mood changes include major life events and genetics.  °RISK FACTORS °If you have any of the following risks for the baby blues or postpartum depression, know what symptoms to watch out for during the postpartum period. Risk factors that may increase the likelihood of getting the baby blues or postpartum depression include: °· Having a personal or family history of depression.   °· Having depression while being pregnant.   °· Having premenstrual mood issues or mood issues related to oral  contraceptives. °· Having a lot of life stress.   °· Having marital conflict.   °· Lacking a social support network.   °· Having a baby with special needs.   °· Having health problems, such as diabetes.   °SIGNS AND SYMPTOMS °Symptoms of baby blues include: °· Brief changes in mood, such as going from extreme happiness to sadness. °· Decreased concentration.   °· Difficulty sleeping.   °· Crying spells, tearfulness.   °· Irritability.   °· Anxiety.   °Symptoms of postpartum depression typically begin within the first month after giving birth. These symptoms include: °· Difficulty sleeping or excessive sleepiness.   °· Marked weight loss.   °· Agitation.   °· Feelings of worthlessness.   °· Lack of interest in activity or food.   °Postpartum psychosis is a very serious condition and can be dangerous. Fortunately, it is   rare. Displaying any of the following symptoms is cause for immediate medical attention. Symptoms of postpartum psychosis include:  °· Hallucinations and delusions.   °· Bizarre or disorganized behavior.   °· Confusion or disorientation.   °DIAGNOSIS  °A diagnosis is made by an evaluation of your symptoms. There are no medical or lab tests that lead to a diagnosis, but there are various questionnaires that a health care provider may use to identify those with the baby blues, postpartum depression, or psychosis. Often, a screening tool called the Edinburgh Postnatal Depression Scale is used to diagnose depression in the postpartum period.  °TREATMENT °The baby blues usually goes away on its own in 1-2 weeks. Social support is often all that is needed. You will be encouraged to get adequate sleep and rest. Occasionally, you may be given medicines to help you sleep.  °Postpartum depression requires treatment because it can last several months or longer if it is not treated. Treatment may include individual or group therapy, medicine, or both to address any social, physiological, and psychological factors  that may play a role in the depression. Regular exercise, a healthy diet, rest, and social support may also be strongly recommended.  °Postpartum psychosis is more serious and needs treatment right away. Hospitalization is often needed. °HOME CARE INSTRUCTIONS °· Get as much rest as you can. Nap when the baby sleeps.   °· Exercise regularly. Some women find yoga and walking to be beneficial.   °· Eat a balanced and nourishing diet.   °· Do little things that you enjoy. Have a cup of tea, take a bubble bath, read your favorite magazine, or listen to your favorite music. °· Avoid alcohol.   °· Ask for help with household chores, cooking, grocery shopping, or running errands as needed. Do not try to do everything.   °· Talk to people close to you about how you are feeling. Get support from your partner, family members, friends, or other new moms. °· Try to stay positive in how you think. Think about the things you are grateful for.   °· Do not spend a lot of time alone.   °· Only take over-the-counter or prescription medicine as directed by your health care provider. °· Keep all your postpartum appointments.   °· Let your health care provider know if you have any concerns.   °SEEK MEDICAL CARE IF: °You are having a reaction to or problems with your medicine. °SEEK IMMEDIATE MEDICAL CARE IF: °· You have suicidal feelings.   °· You think you may harm the baby or someone else. °MAKE SURE YOU: °· Understand these instructions. °· Will watch your condition. °· Will get help right away if you are not doing well or get worse. °  °This information is not intended to replace advice given to you by your health care provider. Make sure you discuss any questions you have with your health care provider. °  °Document Released: 07/13/2004 Document Revised: 10/14/2013 Document Reviewed: 07/21/2013 °Elsevier Interactive Patient Education ©2016 [employer] °Postpartum Care After Vaginal Delivery °After you deliver your newborn  (postpartum period), the usual stay in the hospital is 24-72 hours. If there were problems with your labor or delivery, or if you have other medical problems, you might be in the hospital longer.  °While you are in the hospital, you will receive help and instructions on how to care for yourself and your newborn during the postpartum period.  °While you are in the hospital: °· Be sure to tell your nurses if you have pain or discomfort, as well as   where you feel the pain and what makes the pain worse. °· If you had an incision made near your vagina (episiotomy) or if you had some tearing during delivery, the nurses may put ice packs on your episiotomy or tear. The ice packs may help to reduce the pain and swelling. °· If you are breastfeeding, you may feel uncomfortable contractions of your uterus for a couple of weeks. This is normal. The contractions help your uterus get back to normal size. °· It is normal to have some bleeding after delivery. °¨ For the first 1-3 days after delivery, the flow is red and the amount may be similar to a period. °¨ It is common for the flow to start and stop. °¨ In the first few days, you may pass some small clots. Let your nurses know if you begin to pass large clots or your flow increases. °¨ Do not  flush blood clots down the toilet before having the nurse look at them. °¨ During the next 3-10 days after delivery, your flow should become more watery and pink or brown-tinged in color. °¨ Ten to fourteen days after delivery, your flow should be a small amount of yellowish-white discharge. °¨ The amount of your flow will decrease over the first few weeks after delivery. Your flow may stop in 6-8 weeks. Most women have had their flow stop by 12 weeks after delivery. °· You should change your sanitary pads frequently. °· Wash your hands thoroughly with soap and water for at least 20 seconds after changing pads, using the toilet, or before holding or feeding your newborn. °· You should  feel like you need to empty your bladder within the first 6-8 hours after delivery. °· In case you become weak, lightheaded, or faint, call your nurse before you get out of bed for the first time and before you take a shower for the first time. °· Within the first few days after delivery, your breasts may begin to feel tender and full. This is called engorgement. Breast tenderness usually goes away within 48-72 hours after engorgement occurs. You may also notice milk leaking from your breasts. If you are not breastfeeding, do not stimulate your breasts. Breast stimulation can make your breasts produce more milk. °· Spending as much time as possible with your newborn is very important. During this time, you and your newborn can feel close and get to know each other. Having your newborn stay in your room (rooming in) will help to strengthen the bond with your newborn.  It will give you time to get to know your newborn and become comfortable caring for your newborn. °· Your hormones change after delivery. Sometimes the hormone changes can temporarily cause you to feel sad or tearful. These feelings should not last more than a few days. If these feelings last longer than that, you should talk to your caregiver. °· If desired, talk to your caregiver about methods of family planning or contraception. °· Talk to your caregiver about immunizations. Your caregiver may want you to have the following immunizations before leaving the hospital: °¨ Tetanus, diphtheria, and pertussis (Tdap) or tetanus and diphtheria (Td) immunization. It is very important that you and your family (including grandparents) or others caring for your newborn are up-to-date with the Tdap or Td immunizations. The Tdap or Td immunization can help protect your newborn from getting ill. °¨ Rubella immunization. °¨ Varicella (chickenpox) immunization. °¨ Influenza immunization. You should receive this annual immunization if you did not receive the    immunization during your pregnancy. °  °This information is not intended to replace advice given to you by your health care provider. Make sure you discuss any questions you have with your health care provider. °  °Document Released: 08/06/2007 Document Revised: 07/03/2012 Document Reviewed: 06/05/2012 °[employer]vier Interactive Patient Education ©2016 [employer] °Breastfeeding and Mastitis °Mastitis is inflammation of the breast tissue. It can occur in women who are breastfeeding. This can make breastfeeding painful. Mastitis will sometimes go away on its own. Your health care provider will help determine if treatment is needed. °CAUSES °Mastitis is often associated with a blocked milk (lactiferous) duct. This can happen when too much milk builds up in the breast. Causes of excess milk in the breast can include: °· Poor latch-on. If your baby is not latched onto the breast properly, she or he may not empty your breast completely while breastfeeding. °· Allowing too much time to pass between feedings. °· Wearing a bra or other clothing that is too tight. This puts extra pressure on the lactiferous ducts so milk does not flow through them as it should. °Mastitis can also be caused by a bacterial infection. Bacteria may enter the breast tissue through cuts or openings in the skin. In women who are breastfeeding, this may occur because of cracked or irritated skin. Cracks in the skin are often caused when your baby does not latch on properly to the breast. °SIGNS AND SYMPTOMS °· Swelling, redness, tenderness, and pain in an area of the breast. °· Swelling of the glands under the arm on the same side. °· Fever may or may not accompany mastitis. °If an infection is allowed to progress, a collection of pus (abscess) may develop. °DIAGNOSIS  °Your health care provider can usually diagnose mastitis based on your symptoms and a physical exam. Tests may be done to help confirm the diagnosis. These may include: °· Removal of pus  from the breast by applying pressure to the area. This pus can be examined in the lab to determine which bacteria are present. If an abscess has developed, the fluid in the abscess can be removed with a needle. This can also be used to confirm the diagnosis and determine the bacteria present. In most cases, pus will not be present. °· Blood tests to determine if your body is fighting a bacterial infection. °· Mammogram or ultrasound tests to rule out other problems or diseases. °TREATMENT  °Mastitis that occurs with breastfeeding will sometimes go away on its own. Your health care provider may choose to wait 24 hours after first seeing you to decide whether a prescription medicine is needed. If your symptoms are worse after 24 hours, your health care provider will likely prescribe an antibiotic medicine to treat the mastitis. He or she will determine which bacteria are most likely causing the infection and will then select an appropriate antibiotic medicine. This is sometimes changed based on the results of tests performed to identify the bacteria, or if there is no response to the antibiotic medicine selected. Antibiotic medicines are usually given by mouth. You may also be given medicine for pain. °HOME CARE INSTRUCTIONS °· Only take over-the-counter or prescription medicines for pain, fever, or discomfort as directed by your health care provider. °· If your health care provider prescribed an antibiotic medicine, take the medicine as directed. Make sure you finish it even if you start to feel better. °· Do not wear a tight or underwire bra. Wear a soft, supportive bra. °· Increase your fluid   intake, especially if you have a fever. °· Continue to empty the breast. Your health care provider can tell you whether this milk is safe for your infant or needs to be thrown out. You may be told to stop nursing until your health care provider thinks it is safe for your baby. Use a breast pump if you are advised to stop  nursing. °· Keep your nipples clean and dry. °· Empty the first breast completely before going to the other breast. If your baby is not emptying your breasts completely for some reason, use a breast pump to empty your breasts. °· If you go back to work, pump your breasts while at work to stay in time with your nursing schedule. °· Avoid allowing your breasts to become overly filled with milk (engorged). °SEEK MEDICAL CARE IF: °· You have pus-like discharge from the breast. °· Your symptoms do not improve with the treatment prescribed by your health care provider within 2 days. °SEEK IMMEDIATE MEDICAL CARE IF: °· Your pain and swelling are getting worse. °· You have pain that is not controlled with medicine. °· You have a red line extending from the breast toward your armpit. °· You have a fever or persistent symptoms for more than 2-3 days. °· You have a fever and your symptoms suddenly get worse. °MAKE SURE YOU:  °· Understand these instructions. °· Will watch your condition. °· Will get help right away if you are not doing well or get worse. °  °This information is not intended to replace advice given to you by your health care provider. Make sure you discuss any questions you have with your health care provider. °  °Document Released: 02/03/2005 Document Revised: 10/14/2013 Document Reviewed: 05/15/2013 °[employer]vier Interactive Patient Education ©2016 [employer] ° °Breastfeeding °Deciding to breastfeed is one of the best choices you can make for you and your baby. A change in hormones during pregnancy causes your breast tissue to grow and increases the number and size of your milk ducts. These hormones also allow proteins, sugars, and fats from your blood supply to make breast milk in your milk-producing glands. Hormones prevent breast milk from being released before your baby is born as well as prompt milk flow after birth. Once breastfeeding has begun, thoughts of your baby, as well as his or her sucking or  crying, can stimulate the release of milk from your milk-producing glands.  °BENEFITS OF BREASTFEEDING °For Your Baby °· Your first milk (colostrum) helps your baby's digestive system function better. °· There are antibodies in your milk that help your baby fight off infections. °· Your baby has a lower incidence of asthma, allergies, and sudden infant death syndrome. °· The nutrients in breast milk are better for your baby than infant formulas and are designed uniquely for your baby's needs. °· Breast milk improves your baby's brain development. °· Your baby is less likely to develop other conditions, such as childhood obesity, asthma, or type 2 diabetes mellitus. °For You °· Breastfeeding helps to create a very special bond between you and your baby. °· Breastfeeding is convenient. Breast milk is always available at the correct temperature and costs nothing. °· Breastfeeding helps to burn calories and helps you lose the weight gained during pregnancy. °· Breastfeeding makes your uterus contract to its prepregnancy size faster and slows bleeding (lochia) after you give birth.   °· Breastfeeding helps to lower your risk of developing type 2 diabetes mellitus, osteoporosis, and breast or ovarian cancer later in life. °  SIGNS THAT YOUR BABY IS HUNGRY °Early Signs of Hunger °· Increased alertness or activity. °· Stretching. °· Movement of the head from side to side. °· Movement of the head and opening of the mouth when the corner of the mouth or cheek is stroked (rooting). °· Increased sucking sounds, smacking lips, cooing, sighing, or squeaking. °· Hand-to-mouth movements. °· Increased sucking of fingers or hands. °Late Signs of Hunger °· Fussing. °· Intermittent crying. °Extreme Signs of Hunger °Signs of extreme hunger will require calming and consoling before your baby will be able to breastfeed successfully. Do not wait for the following signs of extreme hunger to occur before you initiate  breastfeeding: °· Restlessness. °· A loud, strong cry. °· Screaming. °BREASTFEEDING BASICS °Breastfeeding Initiation °· Find a comfortable place to sit or lie down, with your neck and back well supported. °· Place a pillow or rolled up blanket under your baby to bring him or her to the level of your breast (if you are seated). Nursing pillows are specially designed to help support your arms and your baby while you breastfeed. °· Make sure that your baby's abdomen is facing your abdomen. °· Gently massage your breast. With your fingertips, massage from your chest wall toward your nipple in a circular motion. This encourages milk flow. You may need to continue this action during the feeding if your milk flows slowly. °· Support your breast with 4 fingers underneath and your thumb above your nipple. Make sure your fingers are well away from your nipple and your baby's mouth. °· Stroke your baby's lips gently with your finger or nipple. °· When your baby's mouth is open wide enough, quickly bring your baby to your breast, placing your entire nipple and as much of the colored area around your nipple (areola) as possible into your baby's mouth. °¨ More areola should be visible above your baby's upper lip than below the lower lip. °¨ Your baby's tongue should be between his or her lower gum and your breast. °· Ensure that your baby's mouth is correctly positioned around your nipple (latched). Your baby's lips should create a seal on your breast and be turned out (everted). °· It is common for your baby to suck about 2-3 minutes in order to start the flow of breast milk. °Latching °Teaching your baby how to latch on to your breast properly is very important. An improper latch can cause nipple pain and decreased milk supply for you and poor weight gain in your baby. Also, if your baby is not latched onto your nipple properly, he or she may swallow some air during feeding. This can make your baby fussy. Burping your baby when  you switch breasts during the feeding can help to get rid of the air. However, teaching your baby to latch on properly is still the best way to prevent fussiness from swallowing air while breastfeeding. °Signs that your baby has successfully latched on to your nipple: °· Silent tugging or silent sucking, without causing you pain. °· Swallowing heard between every 3-4 sucks. °· Muscle movement above and in front of his or her ears while sucking. °Signs that your baby has not successfully latched on to nipple: °· Sucking sounds or smacking sounds from your baby while breastfeeding. °· Nipple pain. °If you think your baby has not latched on correctly, slip your finger into the corner of your baby's mouth to break the suction and place it between your baby's gums. Attempt breastfeeding initiation again. °Signs of Successful Breastfeeding °  Signs from your baby: °· A gradual decrease in the number of sucks or complete cessation of sucking. °· Falling asleep. °· Relaxation of his or her body. °· Retention of a small amount of milk in his or her mouth. °· Letting go of your breast by himself or herself. °Signs from you: °· Breasts that have increased in firmness, weight, and size 1-3 hours after feeding. °· Breasts that are softer immediately after breastfeeding. °· Increased milk volume, as well as a change in milk consistency and color by the fifth day of breastfeeding. °· Nipples that are not sore, cracked, or bleeding. °Signs That Your Baby is Getting Enough Milk °· Wetting at least 3 diapers in a 24-hour period. The urine should be clear and pale yellow by age 5 days. °· At least 3 stools in a 24-hour period by age 5 days. The stool should be soft and yellow. °· At least 3 stools in a 24-hour period by age 7 days. The stool should be seedy and yellow. °· No loss of weight greater than 10% of birth weight during the first 3 days of age. °· Average weight gain of 4-7 ounces (113-198 g) per week after age 4  days. °· Consistent daily weight gain by age 5 days, without weight loss after the age of 2 weeks. °After a feeding, your baby may spit up a small amount. This is common. °BREASTFEEDING FREQUENCY AND DURATION °Frequent feeding will help you make more milk and can prevent sore nipples and breast engorgement. Breastfeed when you feel the need to reduce the fullness of your breasts or when your baby shows signs of hunger. This is called "breastfeeding on demand." Avoid introducing a pacifier to your baby while you are working to establish breastfeeding (the first 4-6 weeks after your baby is born). After this time you may choose to use a pacifier. Research has shown that pacifier use during the first year of a baby's life decreases the risk of sudden infant death syndrome (SIDS). °Allow your baby to feed on each breast as long as he or she wants. Breastfeed until your baby is finished feeding. When your baby unlatches or falls asleep while feeding from the first breast, offer the second breast. Because newborns are often sleepy in the first few weeks of life, you may need to awaken your baby to get him or her to feed. °Breastfeeding times will vary from baby to baby. However, the following rules can serve as a guide to help you ensure that your baby is properly fed: °· Newborns (babies 4 weeks of age or younger) may breastfeed every 1-3 hours. °· Newborns should not go longer than 3 hours during the day or 5 hours during the night without breastfeeding. °· You should breastfeed your baby a minimum of 8 times in a 24-hour period until you begin to introduce solid foods to your baby at around 6 months of age. °BREAST MILK PUMPING °Pumping and storing breast milk allows you to ensure that your baby is exclusively fed your breast milk, even at times when you are unable to breastfeed. This is especially important if you are going back to work while you are still breastfeeding or when you are not able to be present during  feedings. Your lactation consultant can give you guidelines on how long it is safe to store breast milk. °A breast pump is a machine that allows you to pump milk from your breast into a sterile bottle. The pumped breast milk can   then be stored in a refrigerator or freezer. Some breast pumps are operated by hand, while others use electricity. Ask your lactation consultant which type will work best for you. Breast pumps can be purchased, but some hospitals and breastfeeding support groups lease breast pumps on a monthly basis. A lactation consultant can teach you how to hand express breast milk, if you prefer not to use a pump. °CARING FOR YOUR BREASTS WHILE YOU BREASTFEED °Nipples can become dry, cracked, and sore while breastfeeding. The following recommendations can help keep your breasts moisturized and healthy: °· Avoid using soap on your nipples. °· Wear a supportive bra. Although not required, special nursing bras and tank tops are designed to allow access to your breasts for breastfeeding without taking off your entire bra or top. Avoid wearing underwire-style bras or extremely tight bras. °· Air dry your nipples for 3-4 minutes after each feeding. °· Use only cotton bra pads to absorb leaked breast milk. Leaking of breast milk between feedings is normal. °· Use lanolin on your nipples after breastfeeding. Lanolin helps to maintain your skin's normal moisture barrier. If you use pure lanolin, you do not need to wash it off before feeding your baby again. Pure lanolin is not toxic to your baby. You may also hand express a few drops of breast milk and gently massage that milk into your nipples and allow the milk to air dry. °In the first few weeks after giving birth, some women experience extremely full breasts (engorgement). Engorgement can make your breasts feel heavy, warm, and tender to the touch. Engorgement peaks within 3-5 days after you give birth. The following recommendations can help ease  engorgement: °· Completely empty your breasts while breastfeeding or pumping. You may want to start by applying warm, moist heat (in the shower or with warm water-soaked hand towels) just before feeding or pumping. This increases circulation and helps the milk flow. If your baby does not completely empty your breasts while breastfeeding, pump any extra milk after he or she is finished. °· Wear a snug bra (nursing or regular) or tank top for 1-2 days to signal your body to slightly decrease milk production. °· Apply ice packs to your breasts, unless this is too uncomfortable for you. °· Make sure that your baby is latched on and positioned properly while breastfeeding. °If engorgement persists after 48 hours of following these recommendations, contact your health care provider or a lactation consultant. °OVERALL HEALTH CARE RECOMMENDATIONS WHILE BREASTFEEDING °· Eat healthy foods. Alternate between meals and snacks, eating 3 of each per day. Because what you eat affects your breast milk, some of the foods may make your baby more irritable than usual. Avoid eating these foods if you are sure that they are negatively affecting your baby. °· Drink milk, fruit juice, and water to satisfy your thirst (about 10 glasses a day). °· Rest often, relax, and continue to take your prenatal vitamins to prevent fatigue, stress, and anemia. °· Continue breast self-awareness checks. °· Avoid chewing and smoking tobacco. Chemicals from cigarettes that pass into breast milk and exposure to secondhand smoke may harm your baby. °· Avoid alcohol and drug use, including marijuana. °Some medicines that may be harmful to your baby can pass through breast milk. It is important to ask your health care provider before taking any medicine, including all over-the-counter and prescription medicine as well as vitamin and herbal supplements. °It is possible to become pregnant while breastfeeding. If birth control is desired, ask your health care    provider about options that will be safe for your baby. °SEEK MEDICAL CARE IF: °· You feel like you want to stop breastfeeding or have become frustrated with breastfeeding. °· You have painful breasts or nipples. °· Your nipples are cracked or bleeding. °· Your breasts are red, tender, or warm. °· You have a swollen area on either breast. °· You have a fever or chills. °· You have nausea or vomiting. °· You have drainage other than breast milk from your nipples. °· Your breasts do not become full before feedings by the fifth day after you give birth. °· You feel sad and depressed. °· Your baby is too sleepy to eat well. °· Your baby is having trouble sleeping.   °· Your baby is wetting less than 3 diapers in a 24-hour period. °· Your baby has less than 3 stools in a 24-hour period. °· Your baby's skin or the white part of his or her eyes becomes yellow.   °· Your baby is not gaining weight by 5 days of age. °SEEK IMMEDIATE MEDICAL CARE IF: °· Your baby is overly tired (lethargic) and does not want to wake up and feed. °· Your baby develops an unexplained fever. °  °This information is not intended to replace advice given to you by your health care provider. Make sure you discuss any questions you have with your health care provider. °  °Document Released: 10/09/2005 Document Revised: 06/30/2015 Document Reviewed: 04/02/2013 °Elsevier Interactive Patient Education ©2016 [employer] ° °

## 2016-06-28 ENCOUNTER — Telehealth (HOSPITAL_COMMUNITY): Payer: Self-pay | Admitting: Lactation Services

## 2016-06-28 NOTE — Telephone Encounter (Signed)
Baby was born [birth year]. Baby is clicking while feeding. She has been doing this since they got home but it has gotten worse this wk. Mom has bilateral nipple soreness, the L is more sore than the R. Both nipples are cracked per mom. Baby is spitting up and milk is coming out of baby's nose.  Suggested laid back latch. Start post pumping. Reviewed nipple care.  Mom requested OP apt.

## 2016-06-29 ENCOUNTER — Ambulatory Visit (HOSPITAL_COMMUNITY)
Admission: RE | Admit: 2016-06-29 | Discharge: 2016-06-29 | Disposition: A | Discharge status: Home / Self Care | Payer: 59 | Source: Ambulatory Visit | Attending: Obstetrics and Gynecology | Admitting: Obstetrics and Gynecology

## 2016-06-29 NOTE — Lactation Note (Signed)
Lactation Consult  Mother's reason for visit: Mother states that she is hear today for assistance with latch. She states that infant makes a clicking sound when she breastfeeds . Mother states that she has pain on the initial latch on the (R) breast and pain through out feeding on the (L). Visit Type: feeding assessment : Consult:  Initial Lactation Consultant:  Michel BickersKendrick, Madalina Rosman McCoy  ________________________________________________________________________    ________________________________________________________________________  Mother's Name: Kimberly Rivera Type of delivery:  vaginal del Breastfeeding Experience:  2.5 yrs Maternal Medical Conditions:  History post partum depression Maternal Medications:    ________________________________________________________________________  Breastfeeding History (Post Discharge)  Frequency of breastfeeding:  Every 2-3 hours Duration of feeding: 10-15 mins on one breast   Infant Intake and Output Assessment  Voids:   5 in 24 hrs.  Color:  Clear yellow Stools:5   in 24 hrs.  Color:  Yellow  ________________________________________________________________________  Maternal Breast Assessment  Breast:  Full Nipple:  Erect Pain level:  6 when breastfeeding  Pain interventions:  Bra  _______________________________________________________________________ Feeding Assessment/Evaluation:  Mother s nipples have cracking at the back of the nipple shaft bilaterally. Advised mother to phone MD and request APNO to heal nipples.  Mother latched infant on the right breast in cross cradle hold. Infant rolls bottom lip upward   Infant's oral assessment:  Variance slight tight  ant frenula   Positioning:  Cross cradle Right breast  LATCH documentation:  Latch:  2 = Grasps breast easily, tongue down, lips flanged, rhythmical sucking.  Audible swallowing:  2 = Spontaneous and intermittent  Type of nipple:  2 = Everted at rest and after  stimulation  Comfort (Breast/Nipple):  1 = Filling, red/small blisters or bruises, mild/mod discomfort  Hold (Positioning):  1 = Assistance needed to correctly position infant at breast and maintain latch  LATCH score:  8  Attached assessment:  Deep  Lips flanged:  No.  Lips untucked:  No.  Suck assessment:  Displays both    Pre-feed weight: 4248,9-5.9  Post-feed weight: 4312,9-8.1  Amount transferred: 64 ml   Advised to get APNO to heal nipples Mother to conitnue to breastfeed on the right breast and pump the left.  Advised mother to rotate positions frequently to relieve pressure  Mother to breastfeed on the (R) breast only and pump the (L) for several more day Until see nipples healing.

## 2018-07-20 ENCOUNTER — Inpatient Hospital Stay (HOSPITAL_COMMUNITY)
Admission: AD | Admit: 2018-07-20 | Discharge: 2018-07-20 | Disposition: A | Discharge status: Home / Self Care | Payer: 59 | Source: Ambulatory Visit | Attending: Obstetrics and Gynecology | Admitting: Obstetrics and Gynecology

## 2018-07-20 ENCOUNTER — Encounter (HOSPITAL_COMMUNITY): Payer: Self-pay | Admitting: *Deleted

## 2018-07-20 DIAGNOSIS — Z880 Allergy status to penicillin: Secondary | ICD-10-CM | POA: Insufficient documentation

## 2018-07-20 DIAGNOSIS — N946 Dysmenorrhea, unspecified: Secondary | ICD-10-CM | POA: Diagnosis not present

## 2018-07-20 DIAGNOSIS — N970 Female infertility associated with anovulation: Secondary | ICD-10-CM

## 2018-07-20 DIAGNOSIS — N939 Abnormal uterine and vaginal bleeding, unspecified: Secondary | ICD-10-CM | POA: Diagnosis present

## 2018-07-20 LAB — URINALYSIS, ROUTINE W REFLEX MICROSCOPIC
Bacteria, UA: NONE SEEN
Bilirubin Urine: NEGATIVE
Glucose, UA: NEGATIVE mg/dL
Ketones, ur: NEGATIVE mg/dL
Leukocytes, UA: NEGATIVE
Nitrite: NEGATIVE
Protein, ur: NEGATIVE mg/dL
Specific Gravity, Urine: 1.004 — ABNORMAL LOW (ref 1.005–1.030)
pH: 8 (ref 5.0–8.0)

## 2018-07-20 LAB — CBC
HCT: 42.4 % (ref 36.0–46.0)
Hemoglobin: 14.6 g/dL (ref 12.0–15.0)
MCH: 29.7 pg (ref 26.0–34.0)
MCHC: 34.4 g/dL (ref 30.0–36.0)
MCV: 86.4 fL (ref 78.0–100.0)
Platelets: 332 10*3/uL (ref 150–400)
RBC: 4.91 MIL/uL (ref 3.87–5.11)
RDW: 12.5 % (ref 11.5–15.5)
WBC: 7.3 10*3/uL (ref 4.0–10.5)

## 2018-07-20 LAB — POCT PREGNANCY, URINE: Preg Test, Ur: NEGATIVE

## 2018-07-20 MED ORDER — NORETHINDRONE 0.35 MG PO TABS: 1.0000 | ORAL_TABLET | Freq: Every day | ORAL | 11 refills | Status: DC

## 2018-07-20 MED ORDER — KETOROLAC TROMETHAMINE 60 MG/2ML IM SOLN
60.0000 mg | Freq: Once | INTRAMUSCULAR | Status: DC
  Filled 2018-07-20: qty 2

## 2018-07-20 MED ORDER — IBUPROFEN 800 MG PO TABS: 800.0000 mg | ORAL_TABLET | Freq: Three times a day (TID) | ORAL | 6 refills | Status: DC | PRN

## 2018-07-20 NOTE — Discharge Instructions (Signed)
Start minipill today

## 2018-07-20 NOTE — MAU Provider Note (Signed)
History     Chief Complaint  Patient presents with  . Vaginal Bleeding  . Abdominal Pain  . Back Pain  27 yo G2P2 married female presents with c/o 3 days of heavy vaginal bleeding with passage of 25 cents clots. Pt also c/o low back pain radiating to lower abdomen. PMP 7/25. No contraception but does not want anymore children. Hx irregular cycles. Denies urinary sx. Pt has not taken any pain med   OB History    Gravida  2   Para  2   Term  2   Preterm      AB      Living  2     SAB      TAB      Ectopic      Multiple  0   Live Births  1           Past Medical History:  Diagnosis Date  . Asthma   . Hx of varicella   . Postpartum care following vaginal delivery (8/10) 06/01/2016    Past Surgical History:  Procedure Laterality Date  . APPENDECTOMY    . KNEE SURGERY Right   . UMBILICAL HERNIA REPAIR      Family History  Problem Relation Age of Onset  . Hypertension Mother   . Anemia Mother   . Hypertension Father   . Hypertension Maternal Grandmother   . Hypertension Maternal Grandfather   . Diabetes Maternal Grandfather   . Cancer - Lung Maternal Grandfather   . Hypertension Paternal Grandmother   . Hypertension Paternal Grandfather   . Heart attack Maternal Uncle     Social History   Tobacco Use  . Smoking status: Never Smoker  . Smokeless tobacco: Never Used  Substance Use Topics  . Alcohol use: No    Alcohol/week: 0.0 standard drinks  . Drug use: No    Allergies:  Allergies  Allergen Reactions  . Augmentin [Amoxicillin-Pot Clavulanate] Hives  . Codeine Hives    Medications Prior to Admission  Medication Sig Dispense Refill Last Dose  . coconut oil OIL Apply 1 application topically as needed. 1 Bottle 0   . ibuprofen (ADVIL,MOTRIN) 600 MG tablet Take 1 tablet (600 mg total) by mouth every 6 (six) hours. 30 tablet 0   . Prenatal Vit-Fe Fumarate-FA (PRENATAL MULTIVITAMIN) TABS tablet Take 1 tablet by mouth daily at 12 noon.    05/31/2016 at Unknown time  . ranitidine (ZANTAC) 150 MG tablet Take 150 mg by mouth at bedtime.   05/31/2016 at Unknown time     Physical Exam   Blood pressure 131/73, pulse 73, temperature 98.2 F (36.8 C), temperature source Oral, resp. rate 16, weight 92.6 kg, SpO2 100 %, unknown if currently breastfeeding.  General appearance: alert, cooperative and no distress Back: no tenderness to percussion or palpation Lungs: clear to auscultation bilaterally Heart: regular rate and rhythm, S1, S2 normal, no murmur, click, rub or gallop Abdomen: soft, non-tender; bowel sounds normal; no masses,  no organomegaly Pelvic: cervix normal in appearance, external genitalia normal, no adnexal masses or tenderness, no bladder tenderness, uterus normal size, shape, and consistency and cervix closed. (+) scant blood in vagina Extremities: no edema, redness or tenderness in the calves or thighs ED Course  IMP: menstruation  Dysmenorrhea P)  Upt. cbc. Toradol 60mg  IM. Start progesterone only pills D/c home. Motrin script MDM Addenduum: CBC Latest Ref Rng & Units 07/20/2018 06/02/2016 06/01/2016  WBC 4.0 - 10.5 K/uL 7.3 10.7(H) 8.0  Hemoglobin 12.0 - 15.0 g/dL 29.5 62.1 30.8  Hematocrit 36.0 - 46.0 % 42.4 35.6(L) 34.7(L)  Platelets 150 - 400 K/uL 332 192 234    Janijah Symons Cathie Beams, MD 5:11 PM 07/20/2018

## 2018-07-20 NOTE — MAU Note (Signed)
Kimberly Rivera is a 27 y.o.  here in MAU reporting: +heavy vaginal bleeding x3 days. Passed 3 blood clots today-quarter sized. +lower abdominal and back pain--started on Thursday; intensified yesterday--intermittent LMP: July 25th Pain score: 7/10. Has not taken any medication Vitals:   07/20/18 1536  BP: 131/73  Pulse: 73  Resp: 16  Temp: 98.2 F (36.8 C)  SpO2: 100%    Lab orders placed from triage: ua and pregnancy test

## 2019-12-08 ENCOUNTER — Ambulatory Visit: Payer: 59

## 2020-10-08 LAB — OB RESULTS CONSOLE RPR: RPR: NONREACTIVE

## 2020-10-08 LAB — OB RESULTS CONSOLE RUBELLA ANTIBODY, IGM: Rubella: IMMUNE

## 2020-10-08 LAB — OB RESULTS CONSOLE VARICELLA ZOSTER ANTIBODY, IGG: Varicella: IMMUNE

## 2020-10-08 LAB — OB RESULTS CONSOLE HIV ANTIBODY (ROUTINE TESTING): HIV: NONREACTIVE

## 2020-10-13 LAB — AMB REFERRAL TO OB-GYN: Pap: NEGATIVE

## 2020-10-14 LAB — OB RESULTS CONSOLE GC/CHLAMYDIA
Chlamydia: NEGATIVE
Gonorrhea: NEGATIVE

## 2020-10-14 LAB — CYTOLOGY - PAP: Pap: NEGATIVE

## 2020-10-18 LAB — OB RESULTS CONSOLE HEPATITIS B SURFACE ANTIGEN: Hepatitis B Surface Ag: NEGATIVE

## 2020-10-23 NOTE — L&D Delivery Note (Signed)
[facility] Delivery Note  Kimberly Rivera is a 30 y.o. G3P2002 s/p SVD at [redacted]w[redacted]d. She was admitted for SROM and active labor.   ROM: 6h 37m with clear fluid GBS Status: positive < vanc Maximum Maternal Temperature: 98.7  Labor Progress: Labor began spontaneously at 0230 and progressed to SROM then presented to MAU. Progressed to complete with strong urge to push without augmentation.  Delivery Date/Time: 05/11/21 at 0802 Delivery: Called to room and patient was complete and pushing. Head delivered LOA. No nuchal cord present. Shoulder and body delivered in usual fashion. Infant with spontaneous cry, placed on mother's abdomen, dried and stimulated. Cord clamped x 2 after 5-minute delay, and cut by FOB. Cord blood drawn. Placenta delivered spontaneously, intact, with 3-vessel cord. Fundus firm with massage and Pitocin. Labia, perineum, vagina, and cervix inspected, no laceration found. Baby with appropriate HR but slow respirations so taken to warmer for RN and NICU NP evaluation. Stabilized with deep suction and was skin to skin on mom by after delivery.  Placenta: intact, spontaneous, home with mother Complications: none Lacerations: none EBL: 100 Analgesia: none  Postpartum Planning [x]  transfer orders to MB [x]  discharge summary started & shared [x]  message to sent to schedule follow-up  [x]  lists updated [x]  vaccines UTD  Infant: Baby boy (circ-yes)  APGARs 4/8  pending  , CNM, IBCLC Certified Nurse Midwife, [facility] for , [facility] 05/11/2021, 9:25 AM

## 2020-11-26 LAB — AMB REFERRAL TO OB-GYN: AFP: NEGATIVE

## 2021-01-05 ENCOUNTER — Encounter: Payer: Self-pay | Admitting: Certified Nurse Midwife

## 2021-01-05 ENCOUNTER — Other Ambulatory Visit: Payer: Self-pay

## 2021-01-05 ENCOUNTER — Ambulatory Visit (INDEPENDENT_AMBULATORY_CARE_PROVIDER_SITE_OTHER): Payer: 59 | Admitting: Certified Nurse Midwife

## 2021-01-05 VITALS — BP 118/76 | HR 92 | Wt 198.6 lb

## 2021-01-05 DIAGNOSIS — Z3402 Encounter for supervision of normal first pregnancy, second trimester: Secondary | ICD-10-CM | POA: Insufficient documentation

## 2021-01-05 DIAGNOSIS — Z3493 Encounter for supervision of normal pregnancy, unspecified, third trimester: Secondary | ICD-10-CM

## 2021-01-05 DIAGNOSIS — O99340 Other mental disorders complicating pregnancy, unspecified trimester: Secondary | ICD-10-CM

## 2021-01-05 DIAGNOSIS — Z3A21 21 weeks gestation of pregnancy: Secondary | ICD-10-CM

## 2021-01-05 DIAGNOSIS — H6692 Otitis media, unspecified, left ear: Secondary | ICD-10-CM

## 2021-01-05 DIAGNOSIS — Z3400 Encounter for supervision of normal first pregnancy, unspecified trimester: Secondary | ICD-10-CM | POA: Insufficient documentation

## 2021-01-05 DIAGNOSIS — F419 Anxiety disorder, unspecified: Secondary | ICD-10-CM

## 2021-01-05 HISTORY — DX: Encounter for supervision of normal pregnancy, unspecified, third trimester: Z34.93

## 2021-01-05 MED ORDER — DOCUSATE SODIUM 100 MG PO CAPS: 100.0000 mg | ORAL_CAPSULE | Freq: Two times a day (BID) | ORAL | 0 refills | Status: DC

## 2021-01-05 MED ORDER — CEFADROXIL 500 MG PO CAPS: 500.0000 mg | ORAL_CAPSULE | Freq: Two times a day (BID) | ORAL | 0 refills | Status: AC

## 2021-01-06 ENCOUNTER — Encounter: Payer: Self-pay | Admitting: Certified Nurse Midwife

## 2021-01-06 DIAGNOSIS — F419 Anxiety disorder, unspecified: Secondary | ICD-10-CM | POA: Insufficient documentation

## 2021-01-06 NOTE — Progress Notes (Signed)
History:   Kimberly Rivera is a 30 y.o. G3P2002 at [redacted]w[redacted]d by LMP being seen today for her first obstetrical visit.  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 Has had babies over 8lbs but no GDM or shoulder dystocia. Hx precipitous labor.. 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 Pregnancy history fully reviewed.  Patient reports occasional nausea being managed by diclegis prn. Woke up today with nasal congestion and severe ear pain (has a strong history of bacterial ear infections after viral infections and has recovered from Covid within the past month).      HISTORY: OB History  Gravida Para Term Preterm AB Living  3 2 2  0 0 2  SAB IAB Ectopic Multiple Live Births  0 0 0 0 1    # Outcome Date GA Lbr Len/2nd Weight Sex Delivery Anes PTL Lv  3 Current           2 Term 06/01/16 [redacted]w[redacted]d 03:50 / 00:28 7 lb 5.1 oz (3.32 kg) F Vag-Spont EPI  LIV     Name: Okerlund,GIRL Ellarose     Apgar1: 9  Apgar5: 10  1 Term 11/15/12 [redacted]w[redacted]d   M Vag-Spont       Last pap smear was done 10/14/20 and was normal  Past Medical History:  Diagnosis Date  . Asthma   . Hx of varicella   . Postpartum care following vaginal delivery (8/10) 06/01/2016   Past Surgical History:  Procedure Laterality Date  . APPENDECTOMY    . KNEE SURGERY Right   . UMBILICAL HERNIA REPAIR     Family History  Problem Relation Age of Onset  . Hypertension Mother   . Anemia Mother   . Hypertension Father   . Hypertension Maternal Grandmother   . Hypertension Maternal Grandfather   . Diabetes Maternal Grandfather   . Cancer - Lung Maternal Grandfather   . Hypertension Paternal Grandmother   . Hypertension Paternal Grandfather   . Heart attack Maternal Uncle    Social History   Tobacco Use  . Smoking status: Never Smoker  . Smokeless tobacco: Never Used  Substance Use Topics  . Alcohol use: No    Alcohol/week: 0.0 standard drinks   . Drug use: No   Allergies  Allergen Reactions  . Augmentin [Amoxicillin-Pot Clavulanate] Hives  . Codeine Hives   Current Outpatient Medications on File Prior to Visit  Medication Sig Dispense Refill  . DICLEGIS 10-10 MG TBEC PLEASE SEE ATTACHED FOR DETAILED DIRECTIONS    . FLUoxetine (PROZAC) 40 MG capsule Take 40 mg by mouth every morning.    . Prenatal Vit-Fe Fumarate-FA (PRENATAL MULTIVITAMIN) TABS tablet Take 1 tablet by mouth daily at 12 noon.    . Probiotic Product (PROBIOTIC PO) Take by mouth.     No current facility-administered medications on file prior to visit.    Review of Systems Pertinent items noted in HPI and remainder of comprehensive ROS otherwise negative. Physical Exam:   Vitals:   01/05/21 1025  BP: 118/76  Pulse: 92  Weight: 198 lb 9.6 oz (90.1 kg)   Fetal Heart Rate (bpm): 138  Constitutional: Well-developed, well-nourished pregnant female in no acute distress.  HEENT: PERRLA, tympanic membrane bulging with redness around it Skin: normal color and turgor, no rash Cardiovascular: normal rate & rhythm, no murmur Respiratory: normal effort, lung sounds clear throughout GI: Abd soft, non-tender, pos BS x 4, gravid appropriate for gestational age  MS: Extremities nontender, no edema, normal ROM Neurologic: Alert and oriented x 4.  GU: no CVA tenderness Pelvic exam deferred  Assessment:    Pregnancy: [id number] Patient Active Problem List   Diagnosis Date Noted  . Supervision of low-risk first pregnancy 01/05/2021  . Multiparity 06/01/2016  . Migraine 11/02/2014     Plan:    1. Encounter for supervision of low-risk first pregnancy in second trimester - Pt doing well, nausea is very occasional, feeling plenty of fetal movement - Continue prenatal vitamins. - will draw hep C lab with 28wk labs - Problem list reviewed and updated. - Genetic Screening completed at prior OB - Ultrasound discussed; fetal anatomic survey: ordered. - Anticipatory  guidance about prenatal visits given including labs, ultrasounds, and testing. - Discussed usage of Babyscripts and virtual visits as additional source of managing and completing prenatal visits in midst of coronavirus and pandemic.   - [facility] AMB BABYSCRIPTS SCHEDULE OPTIMIZATION - Encouraged to complete MyChart Registration for her ability to review results, send requests, and have questions addressed. MyChart invite sent at appt booking. - The nature of [facility] [facility] with multiple MDs and Advanced Practice Providers was explained to patient; also emphasized that residents, students are part of our team. - Routine obstetric precautions reviewed. Encouraged to seek out care at office or emergency room [facility] preferred) for urgent and/or emergent concerns.  2. [redacted] weeks gestation of pregnancy - Routine OB care - No detailed description of anatomy scan in prior records - [place] MFM OB COMP + 14 WK; Future  3. Acute infection of left ear - Pt usually takes azithromycin for ear infections but antibiotics cause constipation - cefadroxil (DURICEF) 500 MG capsule; Take 1 capsule (500 mg total) by mouth 2 (two) times daily for 7 doses.  Dispense: 7 capsule; Refill: 0 - docusate sodium (COLACE) 100 MG capsule; Take 1 capsule (100 mg total) by mouth 2 (two) times daily.  Dispense: 60 capsule; Refill: 0  Return in about 4 weeks (around 02/02/2021) for IN-PERSON, LOB.    Edd Arbour, MSN, CNM, IBCLC Certified Nurse Midwife, [facility]

## 2021-01-10 ENCOUNTER — Other Ambulatory Visit: Payer: Self-pay | Admitting: Certified Nurse Midwife

## 2021-01-10 DIAGNOSIS — K219 Gastro-esophageal reflux disease without esophagitis: Secondary | ICD-10-CM

## 2021-01-10 MED ORDER — FAMOTIDINE 20 MG PO TABS
20.0000 mg | ORAL_TABLET | Freq: Two times a day (BID) | ORAL | 2 refills | Status: DC
Start: 2021-01-10 — End: 2021-03-10

## 2021-01-10 NOTE — Progress Notes (Signed)
Pt c/o acid reflux, she's had with each pregnancy, Tums no longer helping. Pepcid bid sent to pharmacy.  Edd Arbour, CNM, MSN, IBCLC Certified Nurse Midwife, [facility]

## 2021-01-21 ENCOUNTER — Other Ambulatory Visit: Payer: Self-pay

## 2021-01-21 ENCOUNTER — Ambulatory Visit: Payer: 59 | Attending: Certified Nurse Midwife

## 2021-01-21 DIAGNOSIS — Z3A24 24 weeks gestation of pregnancy: Secondary | ICD-10-CM

## 2021-01-21 DIAGNOSIS — E669 Obesity, unspecified: Secondary | ICD-10-CM | POA: Diagnosis not present

## 2021-01-21 DIAGNOSIS — Z363 Encounter for antenatal screening for malformations: Secondary | ICD-10-CM

## 2021-01-21 DIAGNOSIS — O99212 Obesity complicating pregnancy, second trimester: Secondary | ICD-10-CM

## 2021-01-21 DIAGNOSIS — Z3402 Encounter for supervision of normal first pregnancy, second trimester: Secondary | ICD-10-CM | POA: Diagnosis not present

## 2021-02-02 ENCOUNTER — Other Ambulatory Visit: Payer: Self-pay

## 2021-02-02 ENCOUNTER — Encounter: Payer: Self-pay | Admitting: Certified Nurse Midwife

## 2021-02-09 ENCOUNTER — Other Ambulatory Visit: Payer: Self-pay

## 2021-02-09 ENCOUNTER — Encounter: Payer: Self-pay | Admitting: *Deleted

## 2021-02-09 ENCOUNTER — Ambulatory Visit (INDEPENDENT_AMBULATORY_CARE_PROVIDER_SITE_OTHER): Payer: 59 | Admitting: Certified Nurse Midwife

## 2021-02-09 ENCOUNTER — Other Ambulatory Visit: Payer: 59

## 2021-02-09 VITALS — BP 106/62 | HR 84 | Wt 204.8 lb

## 2021-02-09 DIAGNOSIS — Z3402 Encounter for supervision of normal first pregnancy, second trimester: Secondary | ICD-10-CM

## 2021-02-09 DIAGNOSIS — Z3A26 26 weeks gestation of pregnancy: Secondary | ICD-10-CM

## 2021-02-09 NOTE — Progress Notes (Signed)
   PRENATAL VISIT NOTE  Subjective:  Kimberly Rivera is a 30 y.o. G3P2002 at [redacted]w[redacted]d being seen today for ongoing prenatal care.  She is currently monitored for the following issues for this low-risk pregnancy and has Migraine; Multiparity; Encounter for supervision of normal first pregnancy in second trimester; and Anxiety disorder affecting pregnancy, antepartum on their problem list.  Patient reports no complaints.  Contractions: Not present. Vag. Bleeding: None.  Movement: Present. Denies leaking of fluid.   The following portions of the patient's history were reviewed and updated as appropriate: allergies, current medications, past family history, past medical history, past social history, past surgical history and problem list.   Objective:   Vitals:   02/09/21 0834  BP: 106/62  Pulse: 84  Weight: 204 lb 12.8 oz (92.9 kg)    Fetal Status: Fetal Heart Rate (bpm): 142 Fundal Height: 26 cm Movement: Present     General:  Alert, oriented and cooperative. Patient is in no acute distress.  Skin: Skin is warm and dry. No rash noted.   Cardiovascular: Normal heart rate noted  Respiratory: Normal respiratory effort, no problems with respiration noted  Abdomen: Soft, gravid, appropriate for gestational age.  Pain/Pressure: Absent     Pelvic: Cervical exam deferred        Extremities: Normal range of motion.  Edema: None  Mental Status: Normal mood and affect. Normal behavior. Normal judgment and thought content.   Assessment and Plan:  Pregnancy: G3P2002 at [redacted]w[redacted]d 1. Supervision of low-risk first pregnancy, second trimester - Doing well, feeling vigorous fetal movement  2. [redacted] weeks gestation of pregnancy - Routine OB care - GTT & 3rd trimester labs today - will follow and manage accordingly  Preterm labor symptoms and general obstetric precautions including but not limited to vaginal bleeding, contractions, leaking of fluid and fetal movement were reviewed in detail with the  patient. Please refer to After Visit Summary for other counseling recommendations.   Return in about 3 weeks (around 03/02/2021) for IN-PERSON, LOB.  Future Appointments  Date Time Provider Department Center  03/02/2021  9:35 AM Bernerd Limbo, CNM [facility]    Bernerd Limbo, CNM

## 2021-02-10 LAB — GLUCOSE TOLERANCE, 2 HOURS W/ 1HR
Glucose, 1 hour: 113 mg/dL (ref 65–179)
Glucose, 2 hour: 83 mg/dL (ref 65–152)
Glucose, Fasting: 69 mg/dL (ref 65–91)

## 2021-02-10 LAB — CBC
Hematocrit: 35.6 % (ref 34.0–46.6)
Hemoglobin: 12.5 g/dL (ref 11.1–15.9)
MCH: 31.3 pg (ref 26.6–33.0)
MCHC: 35.1 g/dL (ref 31.5–35.7)
MCV: 89 fL (ref 79–97)
Platelets: 282 10*3/uL (ref 150–450)
RBC: 3.99 x10E6/uL (ref 3.77–5.28)
RDW: 12.4 % (ref 11.7–15.4)
WBC: 9.1 10*3/uL (ref 3.4–10.8)

## 2021-02-10 LAB — HIV ANTIBODY (ROUTINE TESTING W REFLEX): HIV Screen 4th Generation wRfx: NONREACTIVE

## 2021-02-10 LAB — RPR: RPR Ser Ql: NONREACTIVE

## 2021-03-02 ENCOUNTER — Ambulatory Visit (INDEPENDENT_AMBULATORY_CARE_PROVIDER_SITE_OTHER): Payer: 59 | Admitting: Certified Nurse Midwife

## 2021-03-02 ENCOUNTER — Other Ambulatory Visit: Payer: Self-pay

## 2021-03-02 VITALS — BP 122/62 | HR 90 | Wt 206.0 lb

## 2021-03-02 DIAGNOSIS — Z3493 Encounter for supervision of normal pregnancy, unspecified, third trimester: Secondary | ICD-10-CM

## 2021-03-02 DIAGNOSIS — Z3A30 30 weeks gestation of pregnancy: Secondary | ICD-10-CM

## 2021-03-02 DIAGNOSIS — Z3402 Encounter for supervision of normal first pregnancy, second trimester: Secondary | ICD-10-CM

## 2021-03-02 NOTE — Patient Instructions (Signed)

## 2021-03-03 NOTE — Progress Notes (Signed)
   PRENATAL VISIT NOTE  Subjective:  Kimberly Rivera is a 30 y.o. G3P2002 at [redacted]w[redacted]d being seen today for ongoing prenatal care.  She is currently monitored for the following issues for this low-risk pregnancy and has Migraine; Multiparity; Encounter for supervision of normal first pregnancy in second trimester; and Anxiety disorder affecting pregnancy, antepartum on their problem list.  Patient reports no complaints.  Contractions: Not present. Vag. Bleeding: None.  Movement: Present. Denies leaking of fluid.   The following portions of the patient's history were reviewed and updated as appropriate: allergies, current medications, past family history, past medical history, past social history, past surgical history and problem list.   Objective:   Vitals:   03/02/21 0937  BP: 122/62  Pulse: 90  Weight: 206 lb (93.4 kg)    Fetal Status: Fetal Heart Rate (bpm): 145 Fundal Height: 30 cm Movement: Present     General:  Alert, oriented and cooperative. Patient is in no acute distress.  Skin: Skin is warm and dry. No rash noted.   Cardiovascular: Normal heart rate noted  Respiratory: Normal respiratory effort, no problems with respiration noted  Abdomen: Soft, gravid, appropriate for gestational age.  Pain/Pressure: Absent     Pelvic: Cervical exam deferred        Extremities: Normal range of motion.  Edema: None  Mental Status: Normal mood and affect. Normal behavior. Normal judgment and thought content.   Assessment and Plan:  Pregnancy: G3P2002 at [redacted]w[redacted]d 1. Supervision of low-risk pregnancy, third trimester - Doing well, feeling plenty of fetal movement. Recovering from non-covid viral illness.  2. [redacted] weeks gestation of pregnancy - Routine OB care - Answered questions about unmedicated birth and gave reassurance/encouragement re likelihood she can make it through this birth without pain meds given her history of precipitous births and strong support system  Preterm labor symptoms and  general obstetric precautions including but not limited to vaginal bleeding, contractions, leaking of fluid and fetal movement were reviewed in detail with the patient. Please refer to After Visit Summary for other counseling recommendations.   Return in about 2 weeks (around 03/16/2021) for IN-PERSON, LOB.  Future Appointments  Date Time Provider Department Center  03/17/2021 10:35 AM Bernerd Limbo, CNM [facility]    Bernerd Limbo, CNM

## 2021-03-10 ENCOUNTER — Other Ambulatory Visit: Payer: Self-pay | Admitting: Certified Nurse Midwife

## 2021-03-10 DIAGNOSIS — K219 Gastro-esophageal reflux disease without esophagitis: Secondary | ICD-10-CM

## 2021-03-10 DIAGNOSIS — O99613 Diseases of the digestive system complicating pregnancy, third trimester: Secondary | ICD-10-CM

## 2021-03-10 MED ORDER — FAMOTIDINE 40 MG PO TABS: 40.0000 mg | ORAL_TABLET | Freq: Two times a day (BID) | ORAL | 2 refills | Status: DC

## 2021-03-10 NOTE — Progress Notes (Signed)
Updated pt's prescription for pepcid.

## 2021-03-17 ENCOUNTER — Other Ambulatory Visit: Payer: Self-pay

## 2021-03-17 ENCOUNTER — Ambulatory Visit (INDEPENDENT_AMBULATORY_CARE_PROVIDER_SITE_OTHER): Payer: 59 | Admitting: Certified Nurse Midwife

## 2021-03-17 VITALS — BP 115/68 | HR 94 | Wt 208.1 lb

## 2021-03-17 DIAGNOSIS — Z3A32 32 weeks gestation of pregnancy: Secondary | ICD-10-CM

## 2021-03-17 DIAGNOSIS — Z3493 Encounter for supervision of normal pregnancy, unspecified, third trimester: Secondary | ICD-10-CM

## 2021-03-18 NOTE — Progress Notes (Signed)
   PRENATAL VISIT NOTE  Subjective:  Kimberly Rivera is a 30 y.o. G3P2002 at [redacted]w[redacted]d being seen today for ongoing prenatal care.  She is currently monitored for the following issues for this low-risk pregnancy and has Migraine; Multiparity; Encounter for supervision of normal first pregnancy in second trimester; and Anxiety disorder affecting pregnancy, antepartum on their problem list.  Patient reports no complaints.  Contractions: Not present. Vag. Bleeding: None.  Movement: Present. Denies leaking of fluid.   The following portions of the patient's history were reviewed and updated as appropriate: allergies, current medications, past family history, past medical history, past social history, past surgical history and problem list.   Objective:   Vitals:   03/17/21 1046  BP: 115/68  Pulse: 94  Weight: 208 lb 1.6 oz (94.4 kg)    Fetal Status:     Movement: Present     General:  Alert, oriented and cooperative. Patient is in no acute distress.  Skin: Skin is warm and dry. No rash noted.   Cardiovascular: Normal heart rate noted  Respiratory: Normal respiratory effort, no problems with respiration noted  Abdomen: Soft, gravid, appropriate for gestational age.  Pain/Pressure: Absent     Pelvic: Cervical exam deferred        Extremities: Normal range of motion.  Edema: None  Mental Status: Normal mood and affect. Normal behavior. Normal judgment and thought content.   Assessment and Plan:  Pregnancy: G3P2002 at [redacted]w[redacted]d 1. Supervision of low-risk pregnancy, third trimester - Doing well, feeling vigorous fetal movement and feeling energetic at work (works as Financial controller at [employer])  2. [redacted] weeks gestation of pregnancy - Routine OB care - Needs to attend waterbirth class  Preterm labor symptoms and general obstetric precautions including but not limited to vaginal bleeding, contractions, leaking of fluid and fetal movement were reviewed in detail with the patient. Please refer to After  Visit Summary for other counseling recommendations.   Return in about 2 weeks (around 03/31/2021) for IN-PERSON, LOB.  Future Appointments  Date Time Provider Department Center  04/06/2021 10:55 AM Bernerd Limbo, CNM [facility]    Bernerd Limbo, CNM

## 2021-03-25 ENCOUNTER — Other Ambulatory Visit: Payer: Self-pay

## 2021-03-25 ENCOUNTER — Encounter (HOSPITAL_COMMUNITY): Payer: Self-pay | Admitting: Obstetrics and Gynecology

## 2021-03-25 ENCOUNTER — Inpatient Hospital Stay (HOSPITAL_COMMUNITY)
Admission: AD | Admit: 2021-03-25 | Discharge: 2021-03-25 | Disposition: A | Discharge status: Home / Self Care | Payer: 59 | Attending: Obstetrics and Gynecology | Admitting: Obstetrics and Gynecology

## 2021-03-25 DIAGNOSIS — K219 Gastro-esophageal reflux disease without esophagitis: Secondary | ICD-10-CM | POA: Diagnosis not present

## 2021-03-25 DIAGNOSIS — R4589 Other symptoms and signs involving emotional state: Secondary | ICD-10-CM

## 2021-03-25 DIAGNOSIS — O26893 Other specified pregnancy related conditions, third trimester: Secondary | ICD-10-CM | POA: Insufficient documentation

## 2021-03-25 DIAGNOSIS — O99343 Other mental disorders complicating pregnancy, third trimester: Secondary | ICD-10-CM | POA: Insufficient documentation

## 2021-03-25 DIAGNOSIS — Z3A33 33 weeks gestation of pregnancy: Secondary | ICD-10-CM | POA: Diagnosis not present

## 2021-03-25 DIAGNOSIS — O99613 Diseases of the digestive system complicating pregnancy, third trimester: Secondary | ICD-10-CM | POA: Insufficient documentation

## 2021-03-25 DIAGNOSIS — R109 Unspecified abdominal pain: Secondary | ICD-10-CM | POA: Insufficient documentation

## 2021-03-25 DIAGNOSIS — F419 Anxiety disorder, unspecified: Secondary | ICD-10-CM | POA: Diagnosis not present

## 2021-03-25 DIAGNOSIS — R197 Diarrhea, unspecified: Secondary | ICD-10-CM | POA: Diagnosis not present

## 2021-03-25 DIAGNOSIS — O26899 Other specified pregnancy related conditions, unspecified trimester: Secondary | ICD-10-CM

## 2021-03-25 LAB — WET PREP, GENITAL
Clue Cells Wet Prep HPF POC: NONE SEEN
Sperm: NONE SEEN
Trich, Wet Prep: NONE SEEN
Yeast Wet Prep HPF POC: NONE SEEN

## 2021-03-25 LAB — URINALYSIS, ROUTINE W REFLEX MICROSCOPIC
Bilirubin Urine: NEGATIVE
Glucose, UA: NEGATIVE mg/dL
Hgb urine dipstick: NEGATIVE
Ketones, ur: NEGATIVE mg/dL
Nitrite: NEGATIVE
Protein, ur: NEGATIVE mg/dL
Specific Gravity, Urine: 1.004 — ABNORMAL LOW (ref 1.005–1.030)
pH: 7 (ref 5.0–8.0)

## 2021-03-25 LAB — FETAL FIBRONECTIN: Fetal Fibronectin: NEGATIVE

## 2021-03-25 MED ORDER — PANTOPRAZOLE SODIUM 20 MG PO TBEC: 20.0000 mg | DELAYED_RELEASE_TABLET | Freq: Two times a day (BID) | ORAL | 2 refills | Status: DC

## 2021-03-25 MED ORDER — LOPERAMIDE HCL 2 MG PO CAPS
2.0000 mg | ORAL_CAPSULE | ORAL | Status: DC | PRN
  Administered 2021-03-25: 2 mg via ORAL
  Filled 2021-03-25: qty 1

## 2021-03-25 NOTE — MAU Note (Signed)
Been having cramping since last night, just has not gotten any better. Now back is hurting too. No bleeding or leaking. No hx of PTL. Feeling a lot of pelvic pressure too.
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
## 2021-04-04 ENCOUNTER — Encounter: Payer: 59 | Admitting: Obstetrics and Gynecology

## 2021-04-06 ENCOUNTER — Telehealth (INDEPENDENT_AMBULATORY_CARE_PROVIDER_SITE_OTHER): Payer: 59 | Admitting: Certified Nurse Midwife

## 2021-04-06 VITALS — BP 113/71 | HR 91

## 2021-04-06 DIAGNOSIS — O99613 Diseases of the digestive system complicating pregnancy, third trimester: Secondary | ICD-10-CM

## 2021-04-06 DIAGNOSIS — Z3493 Encounter for supervision of normal pregnancy, unspecified, third trimester: Secondary | ICD-10-CM

## 2021-04-06 DIAGNOSIS — Z3A34 34 weeks gestation of pregnancy: Secondary | ICD-10-CM

## 2021-04-06 DIAGNOSIS — K219 Gastro-esophageal reflux disease without esophagitis: Secondary | ICD-10-CM

## 2021-04-06 MED ORDER — PANTOPRAZOLE SODIUM 20 MG PO TBEC: 40.0000 mg | DELAYED_RELEASE_TABLET | Freq: Every day | ORAL | 2 refills | Status: DC

## 2021-04-06 NOTE — Progress Notes (Signed)
I connected with  Kimberly Rivera on 04/06/21 at 1058 by MyChart video and verified that I am speaking with the correct person using two identifiers.   I discussed the limitations, risks, security and privacy concerns of performing an evaluation and management service by telephone and the availability of in person appointments. I also discussed with the patient that there may be a patient responsible charge related to this service. The patient expressed understanding and agreed to proceed.  Reports issue with Protonix rx; pharmacy told pt this needs approval from provider office. Called [facility]. Pharmacy staff reports insurance will not pay for 20 mg tablets without PA. Will cover 40 mg tablet. Dan Humphreys, CNM notified.  Marjo Bicker, RN 04/06/2021  10:59 AM

## 2021-04-06 NOTE — Progress Notes (Signed)
OBSTETRICS PRENATAL VIRTUAL VISIT ENCOUNTER NOTE  Provider location: Center for [facility]   Patient location: Home  I connected with Kimberly Rivera on 04/06/21 at 10:55 AM EDT by MyChart Video Encounter and verified that I am speaking with the correct person using two identifiers. I discussed the limitations, risks, security and privacy concerns of performing an evaluation and management service virtually and the availability of in person appointments. I also discussed with the patient that there may be a patient responsible charge related to this service. The patient expressed understanding and agreed to proceed. Subjective:  Kimberly Rivera is a 30 y.o. G3P2002 at [redacted]w[redacted]d being seen today for ongoing prenatal care.  She is currently monitored for the following issues for this low-risk pregnancy and has Migraine; Multiparity; Encounter for supervision of normal first pregnancy in second trimester; and Anxiety disorder affecting pregnancy, antepartum on their problem list.  Patient reports no complaints. Seen in MAU between visits after a traumatic event at work for anxiety induced cramping/diarrhea. Cervix was closed and NST was reactive. Cramping and BH  have ceased. Pt reports increased generalized anxiety and is planning to reach out to her psychiatrist to increase her anxiety medication to get ahead of any perinatal mood alterations.  Contractions: Not present. Vag. Bleeding: None.  Movement: Present. Denies any leaking of fluid.   The following portions of the patient's history were reviewed and updated as appropriate: allergies, current medications, past family history, past medical history, past social history, past surgical history and problem list.   Objective:   Vitals:   04/06/21 1100  BP: 113/71  Pulse: 91    Fetal Status:     Movement: Present     General:  Alert, oriented and cooperative. Patient is in no acute distress.  Respiratory: Normal  respiratory effort, no problems with respiration noted  Mental Status: Normal mood and affect. Normal behavior. Normal judgment and thought content.  Rest of physical exam deferred due to type of encounter  Imaging: No results found.  Assessment and Plan:  Pregnancy: G3P2002 at [redacted]w[redacted]d 1. Supervision of low-risk pregnancy, third trimester - Doing well, feeling regular and vigorous fetal movement - Praised her self-awareness and validated decision to call for increased meds - Suggested early work release (39wks vs 40wks) so she can relax and prepare for the arrival of this baby.  2. [redacted] weeks gestation of pregnancy - Routine OB care  3. Gastroesophageal reflux in pregnancy in third trimester - Medication not covered by insurance as prescribed, will change dose to 40mg  daily, pt can increase to BID if needed.  Preterm labor symptoms and general obstetric precautions including but not limited to vaginal bleeding, contractions, leaking of fluid and fetal movement were reviewed in detail with the patient. I discussed the assessment and treatment plan with the patient. The patient was provided an opportunity to ask questions and all were answered. The patient agreed with the plan and demonstrated an understanding of the instructions. The patient was advised to call back or seek an in-person office evaluation/go to MAU at [facility] for any urgent or concerning symptoms. Please refer to After Visit Summary for other counseling recommendations.   I provided 15 minutes of face-to-face time during this encounter.  Return for IN-PERSON, LOB w GBS.  Future Appointments  Date Time Provider Department Center  04/13/2021  9:40 AM 04/15/2021, CNM [facility]    [facility] CNM Center for Bernerd Limbo, [facility]

## 2021-04-13 ENCOUNTER — Ambulatory Visit (INDEPENDENT_AMBULATORY_CARE_PROVIDER_SITE_OTHER): Payer: 59 | Admitting: Certified Nurse Midwife

## 2021-04-13 ENCOUNTER — Other Ambulatory Visit: Payer: Self-pay

## 2021-04-13 VITALS — BP 114/57 | HR 87 | Wt 211.3 lb

## 2021-04-13 DIAGNOSIS — Z23 Encounter for immunization: Secondary | ICD-10-CM | POA: Diagnosis not present

## 2021-04-13 DIAGNOSIS — Z3493 Encounter for supervision of normal pregnancy, unspecified, third trimester: Secondary | ICD-10-CM | POA: Diagnosis not present

## 2021-04-13 DIAGNOSIS — O99613 Diseases of the digestive system complicating pregnancy, third trimester: Secondary | ICD-10-CM

## 2021-04-13 DIAGNOSIS — K219 Gastro-esophageal reflux disease without esophagitis: Secondary | ICD-10-CM

## 2021-04-13 DIAGNOSIS — Z20828 Contact with and (suspected) exposure to other viral communicable diseases: Secondary | ICD-10-CM

## 2021-04-13 DIAGNOSIS — O219 Vomiting of pregnancy, unspecified: Secondary | ICD-10-CM

## 2021-04-13 DIAGNOSIS — Z3A35 35 weeks gestation of pregnancy: Secondary | ICD-10-CM

## 2021-04-13 MED ORDER — ONDANSETRON 8 MG PO TBDP: 8.0000 mg | ORAL_TABLET | Freq: Three times a day (TID) | ORAL | 0 refills | Status: DC | PRN

## 2021-04-13 MED ORDER — OMEPRAZOLE MAGNESIUM 20 MG PO TBEC: 40.0000 mg | DELAYED_RELEASE_TABLET | Freq: Every day | ORAL | 1 refills | Status: DC

## 2021-04-14 LAB — CMV ABS, IGG+IGM (CYTOMEGALOVIRUS)
CMV Ab - IgG: 3.3 U/mL — ABNORMAL HIGH (ref 0.00–0.59)
CMV IgM Ser EIA-aCnc: <30 AU/mL (ref 0.0–29.9)

## 2021-04-14 NOTE — Progress Notes (Signed)
PRENATAL VISIT NOTE  Subjective:  Kimberly Rivera is a 30 y.o. G3P2002 at [redacted]w[redacted]d being seen today for ongoing prenatal care.  She is currently monitored for the following issues for this low-risk pregnancy and has Migraine; Supervision of low-risk pregnancy, third trimester; Anxiety disorder affecting pregnancy, antepartum; and Gastroesophageal reflux in pregnancy in third trimester on their problem list.  Patient reports occasional contractions and some cramping she attributes to work stress, but wants to be checked in case it is causing cervical change. 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 Needs refill of prescriptions to new pharmacy as her current pharmacy will not fill prilosec script despite it being changed as requested. Brought waterbirth certificate to visit .  Contractions: Not present. Vag. Bleeding: None.  Movement: Present. Denies leaking of fluid.   The following portions of the patient's history were reviewed and updated as appropriate: allergies, current medications, past family history, past medical history, past social history, past surgical history and problem list.   Objective:   Vitals:   04/13/21 0950  BP: (!) 114/57  Pulse: 87  Weight: 211 lb 4.8 oz (95.8 kg)    Fetal Status: Fetal Heart Rate (bpm): 136 Fundal Height: 36 cm Movement: Present  Presentation: Vertex  General:  Alert, oriented and cooperative. Patient is in no acute distress.  Skin: Skin is warm and dry. No rash noted.   Cardiovascular: Normal heart rate noted  Respiratory: Normal respiratory effort, no problems with respiration noted  Abdomen: Soft, gravid, appropriate for gestational age.  Pain/Pressure: Absent     Pelvic: Cervical exam performed in the presence of a chaperone Dilation: 1 Effacement (%): Thick Station: Ballotable  Extremities: Normal range of motion.  Edema: None  Mental Status: Normal mood and affect. Normal  behavior. Normal judgment and thought content.   Assessment and Plan:  Pregnancy: G3P2002 at [redacted]w[redacted]d 1. Supervision of low-risk pregnancy, third trimester - Overall doing well, feeling regular and vigorous fetal movement. Very concerned about CMV exposure and work stress. - Will write note so she can go out of work starting Monday, 04/18/21.  2. [redacted] weeks gestation of pregnancy - Routine OB care including anticipatory guidance re GBS testing at next visit - FOB interested in helping catch the baby, encouraged her to have him attend next visit so we can review hand-over-hand catch and car deliveries (as they will be driving from near [facility] and pt has a history of precipitous deliveries)  3. 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 Need for Tdap vaccination - Tdap vaccine greater than or equal to 7yo IM  5. Nausea and vomiting during pregnancy - Pregnancy nausea has returned, diclegis is not working. Will refill zofran, but also encouraged use of prilosec since that may be the real cause of her nausea - ondansetron (ZOFRAN ODT) 8 MG disintegrating tablet; Take 1 tablet (8 mg total) by mouth every 8 (eight) hours as needed for nausea or vomiting.  Dispense: 20 tablet; Refill: 0  6. Gastroesophageal reflux during pregnancy in third trimester, antepartum - omeprazole (PRILOSEC OTC) 20 MG tablet; Take 2 tablets (40 mg total) by mouth daily.  Dispense: 60 tablet; Refill: 1  Preterm labor symptoms and general obstetric precautions including but not limited to vaginal bleeding, contractions, leaking of fluid and fetal movement were reviewed in detail with the patient. Please refer to After Visit Summary for other counseling recommendations.  Return in about 1 week (around 04/20/2021) for LOB w GBS.  Future Appointments  Date Time Provider Department Center  04/21/2021  9:55 AM Bernerd Limbo, CNM [facility]    Bernerd Limbo, CNM

## 2021-04-21 ENCOUNTER — Other Ambulatory Visit: Payer: Self-pay

## 2021-04-21 ENCOUNTER — Encounter: Payer: Self-pay | Admitting: *Deleted

## 2021-04-21 ENCOUNTER — Other Ambulatory Visit (HOSPITAL_COMMUNITY)
Admission: RE | Admit: 2021-04-21 | Discharge: 2021-04-21 | Disposition: A | Discharge status: Home / Self Care | Payer: 59 | Source: Ambulatory Visit | Attending: Certified Nurse Midwife | Admitting: Certified Nurse Midwife

## 2021-04-21 ENCOUNTER — Ambulatory Visit (INDEPENDENT_AMBULATORY_CARE_PROVIDER_SITE_OTHER): Payer: 59 | Admitting: Certified Nurse Midwife

## 2021-04-21 VITALS — BP 109/70 | HR 92 | Wt 212.7 lb

## 2021-04-21 DIAGNOSIS — Z3A37 37 weeks gestation of pregnancy: Secondary | ICD-10-CM | POA: Insufficient documentation

## 2021-04-21 DIAGNOSIS — Z3403 Encounter for supervision of normal first pregnancy, third trimester: Secondary | ICD-10-CM | POA: Insufficient documentation

## 2021-04-21 NOTE — Progress Notes (Signed)
   PRENATAL VISIT NOTE  Subjective:  Kimberly Rivera is a 30 y.o. G3P2002 at [redacted]w[redacted]d being seen today for ongoing prenatal care.  She is currently monitored for the following issues for this low-risk pregnancy and has Migraine; Supervision of low-risk pregnancy, third trimester; Anxiety disorder affecting pregnancy, antepartum; and Gastroesophageal reflux in pregnancy in third trimester on their problem list.  Patient reports occasional contractions and decreasing appetite which she attributes to late pregnancy, she has experienced this with all other pregnancies. Had an hour of regular, strong contractions last night but went away after she took a bath .  Contractions: Irritability. Vag. Bleeding: None.  Movement: Present. Denies leaking of fluid.   The following portions of the patient's history were reviewed and updated as appropriate: allergies, current medications, past family history, past medical history, past social history, past surgical history and problem list.   Objective:   Vitals:   04/21/21 1018  BP: 109/70  Pulse: 92  Weight: 212 lb 11.2 oz (96.5 kg)    Fetal Status: Fetal Heart Rate (bpm): 148 Fundal Height: 37 cm Movement: Present  Presentation: Vertex  General:  Alert, oriented and cooperative. Patient is in no acute distress.  Skin: Skin is warm and dry. No rash noted.   Cardiovascular: Normal heart rate noted  Respiratory: Normal respiratory effort, no problems with respiration noted  Abdomen: Soft, gravid, appropriate for gestational age.  Pain/Pressure: Absent     Pelvic: Cervical exam deferred        Extremities: Normal range of motion.  Edema: None  Mental Status: Normal mood and affect. Normal behavior. Normal judgment and thought content.   Assessment and Plan:  Pregnancy: G3P2002 at 105w0d 1. Supervision of low-risk first pregnancy, third trimester - Doing well, feeling regular and vigorous fetal movement - Wally (FOB) present to review what to do in case of  precipitous car delivery and his role in hand-over-hand catch for waterbirth.  2. [redacted] weeks gestation of pregnancy - Routine OB care - Culture, beta strep (group b only) - Cervicovaginal ancillary only( [place])  Term labor symptoms and general obstetric precautions including but not limited to vaginal bleeding, contractions, leaking of fluid and fetal movement were reviewed in detail with the patient. Please refer to After Visit Summary for other counseling recommendations.   Return in about 1 week (around 04/28/2021) for IN-PERSON, LOB.  Future Appointments  Date Time Provider Department Center  04/28/2021  3:35 PM Warden Fillers, MD [facility]  05/04/2021 10:55 AM Bernerd Limbo, CNM [facility]  05/11/2021 10:35 AM Calvert Cantor, CNM [facility]   Bernerd Limbo, CNM

## 2021-04-22 LAB — CERVICOVAGINAL ANCILLARY ONLY
Chlamydia: NEGATIVE
Comment: NEGATIVE
Comment: NORMAL
Neisseria Gonorrhea: NEGATIVE

## 2021-04-24 LAB — CULTURE, BETA STREP (GROUP B ONLY): Strep Gp B Culture: POSITIVE — AB

## 2021-04-28 ENCOUNTER — Ambulatory Visit (INDEPENDENT_AMBULATORY_CARE_PROVIDER_SITE_OTHER): Payer: 59 | Admitting: Obstetrics and Gynecology

## 2021-04-28 ENCOUNTER — Other Ambulatory Visit: Payer: Self-pay

## 2021-04-28 DIAGNOSIS — Z3A38 38 weeks gestation of pregnancy: Secondary | ICD-10-CM | POA: Insufficient documentation

## 2021-04-28 DIAGNOSIS — Z3493 Encounter for supervision of normal pregnancy, unspecified, third trimester: Secondary | ICD-10-CM

## 2021-04-28 NOTE — Progress Notes (Signed)
   PRENATAL VISIT NOTE  Subjective:  Kimberly Rivera is a 30 y.o. G3P2002 at 101w0d being seen today for ongoing prenatal care.  She is currently monitored for the following issues for this low-risk pregnancy and has Migraine; Supervision of low-risk pregnancy, third trimester; Anxiety disorder affecting pregnancy, antepartum; Gastroesophageal reflux in pregnancy in third trimester; and [redacted] weeks gestation of pregnancy on their problem list.  Patient doing well with no acute concerns today. She reports  pelvic pressure .  Contractions: Irritability. Vag. Bleeding: None.  Movement: Present. Denies leaking of fluid.   The following portions of the patient's history were reviewed and updated as appropriate: allergies, current medications, past family history, past medical history, past social history, past surgical history and problem list. Problem list updated.  Objective:   Vitals:   04/28/21 1617  BP: 123/71  Pulse: (!) 103  Weight: 210 lb 6.4 oz (95.4 kg)    Fetal Status: Fetal Heart Rate (bpm): 153 Fundal Height: 38 cm Movement: Present     General:  Alert, oriented and cooperative. Patient is in no acute distress.  Skin: Skin is warm and dry. No rash noted.   Cardiovascular: Normal heart rate noted  Respiratory: Normal respiratory effort, no problems with respiration noted  Abdomen: Soft, gravid, appropriate for gestational age.  Pain/Pressure: Absent     Pelvic: Cervical exam performed Dilation: 1 Effacement (%): 60 Station: -3  Extremities: Normal range of motion.  Edema: None  Mental Status:  Normal mood and affect. Normal behavior. Normal judgment and thought content.   Assessment and Plan:  Pregnancy: G3P2002 at [redacted]w[redacted]d  1. Supervision of low-risk pregnancy, third trimester Continue routine care  2. [redacted] weeks gestation of pregnancy   Term labor symptoms and general obstetric precautions including but not limited to vaginal bleeding, contractions, leaking of fluid and fetal  movement were reviewed in detail with the patient.  Please refer to After Visit Summary for other counseling recommendations.   Return in about 1 week (around 05/05/2021) for LOB, in person.   Mariel Aloe, MD Faculty Attending [facility]

## 2021-05-04 ENCOUNTER — Ambulatory Visit (INDEPENDENT_AMBULATORY_CARE_PROVIDER_SITE_OTHER): Payer: 59 | Admitting: Certified Nurse Midwife

## 2021-05-04 ENCOUNTER — Other Ambulatory Visit: Payer: Self-pay

## 2021-05-04 VITALS — BP 113/72 | HR 99 | Wt 211.8 lb

## 2021-05-04 DIAGNOSIS — Z3A38 38 weeks gestation of pregnancy: Secondary | ICD-10-CM

## 2021-05-04 DIAGNOSIS — Z3493 Encounter for supervision of normal pregnancy, unspecified, third trimester: Secondary | ICD-10-CM

## 2021-05-04 NOTE — Progress Notes (Signed)
Patient does not have any questions or concerns, stated that there are no changes in health.

## 2021-05-04 NOTE — Progress Notes (Signed)
   PRENATAL VISIT NOTE  Subjective:  Kimberly Rivera is a 30 y.o. G3P2002 at [redacted]w[redacted]d being seen today for ongoing prenatal care.  She is currently monitored for the following issues for this low-risk pregnancy and has Migraine; Supervision of low-risk pregnancy, third trimester; Anxiety disorder affecting pregnancy, antepartum; Gastroesophageal reflux in pregnancy in third trimester; and [redacted] weeks gestation of pregnancy on their problem list.  Patient reports  several episodes of contractions, usually at night and wane by morning. Would like her cervix checked today .  Contractions: Irritability. Vag. Bleeding: None.  Movement: Present. Denies leaking of fluid.   The following portions of the patient's history were reviewed and updated as appropriate: allergies, current medications, past family history, past medical history, past social history, past surgical history and problem list.   Objective:   Vitals:   05/04/21 1106  BP: 113/72  Pulse: 99  Weight: 211 lb 12.8 oz (96.1 kg)    Fetal Status: Fetal Heart Rate (bpm): 138 Fundal Height: 39 cm Movement: Present  Presentation: Vertex  General:  Alert, oriented and cooperative. Patient is in no acute distress.  Skin: Skin is warm and dry. No rash noted.   Cardiovascular: Normal heart rate noted  Respiratory: Normal respiratory effort, no problems with respiration noted  Abdomen: Soft, gravid, appropriate for gestational age.  Pain/Pressure: Absent     Pelvic: Cervical exam performed in the presence of a chaperone Dilation: 2.5 Effacement (%): 70 Station: -3  Extremities: Normal range of motion.  Edema: None  Mental Status: Normal mood and affect. Normal behavior. Normal judgment and thought content.   Assessment and Plan:  Pregnancy: G3P2002 at [redacted]w[redacted]d Supervision of low risk pregnancy, third trimester - Doing well, feeling regular and vigorous fetal movement  2. [redacted] weeks gestation of pregnancy - Routine OB care - Reviewed plan for childcare  during labor/hospital stay and notification of CNM specialing for delivery  Term labor symptoms and general obstetric precautions including but not limited to vaginal bleeding, contractions, leaking of fluid and fetal movement were reviewed in detail with the patient. Please refer to After Visit Summary for other counseling recommendations.   Return in about 1 week (around 05/11/2021) for IN-PERSON, LOB.  Future Appointments  Date Time Provider Department Center  05/11/2021 10:35 AM Calvert Cantor, CNM [facility]    Bernerd Limbo, CNM

## 2021-05-06 ENCOUNTER — Encounter (HOSPITAL_COMMUNITY): Payer: Self-pay | Admitting: Obstetrics and Gynecology

## 2021-05-06 ENCOUNTER — Inpatient Hospital Stay (HOSPITAL_COMMUNITY)
Admission: AD | Admit: 2021-05-06 | Discharge: 2021-05-06 | Disposition: A | Discharge status: Home / Self Care | Payer: 59 | Attending: Obstetrics and Gynecology | Admitting: Obstetrics and Gynecology

## 2021-05-06 DIAGNOSIS — Z3493 Encounter for supervision of normal pregnancy, unspecified, third trimester: Secondary | ICD-10-CM

## 2021-05-06 DIAGNOSIS — O471 False labor at or after 37 completed weeks of gestation: Secondary | ICD-10-CM | POA: Insufficient documentation

## 2021-05-06 DIAGNOSIS — O479 False labor, unspecified: Secondary | ICD-10-CM

## 2021-05-06 DIAGNOSIS — Z3A39 39 weeks gestation of pregnancy: Secondary | ICD-10-CM | POA: Diagnosis not present

## 2021-05-06 NOTE — MAU Note (Signed)
PT SAYS SHE HAD BLOODY SHOW AT 3 PM- SINCE HAS HAD UC'S. PNC WITH CLINIC VE ON WED 2-3 CM.   DENIES HSV GBS- POSITIVE

## 2021-05-06 NOTE — MAU Provider Note (Signed)
S: Ms. Deslyn Cavenaugh is a 30 y.o. G3P2002 at [redacted]w[redacted]d  who presents to MAU today for labor evaluation.     Cervical exam by RN:  Dilation: 3 Effacement (%): 90 Cervical Position: Posterior Station: -2 Presentation: Vertex Exam by:: Lestine Box, RN  No change after one hour  Fetal Monitoring: Baseline: 140 Variability: average Accelerations: Present Decelerations: Absent Contractions: q42min  MDM Discussed patient with RN. NST reviewed.   A: SIUP at [redacted]w[redacted]d  False labor  P: Discharge home Labor precautions and kick counts included in AVS Patient to follow-up with office as scheduled  Patient may return to MAU as needed or when in labor   Valora Piccolo 05/06/2021 11:46 PM

## 2021-05-11 ENCOUNTER — Other Ambulatory Visit: Payer: Self-pay

## 2021-05-11 ENCOUNTER — Encounter: Payer: 59 | Admitting: Advanced Practice Midwife

## 2021-05-11 ENCOUNTER — Inpatient Hospital Stay (HOSPITAL_COMMUNITY)
Admission: AD | Admit: 2021-05-11 | Discharge: 2021-05-13 | DRG: 807 | Disposition: A | Discharge status: Home / Self Care | Payer: 59 | Attending: Family Medicine | Admitting: Family Medicine

## 2021-05-11 ENCOUNTER — Encounter (HOSPITAL_COMMUNITY): Payer: Self-pay | Admitting: Obstetrics and Gynecology

## 2021-05-11 DIAGNOSIS — O99344 Other mental disorders complicating childbirth: Secondary | ICD-10-CM | POA: Diagnosis present

## 2021-05-11 DIAGNOSIS — O9962 Diseases of the digestive system complicating childbirth: Secondary | ICD-10-CM | POA: Diagnosis present

## 2021-05-11 DIAGNOSIS — Z20822 Contact with and (suspected) exposure to covid-19: Secondary | ICD-10-CM | POA: Diagnosis present

## 2021-05-11 DIAGNOSIS — O4202 Full-term premature rupture of membranes, onset of labor within 24 hours of rupture: Secondary | ICD-10-CM

## 2021-05-11 DIAGNOSIS — Z880 Allergy status to penicillin: Secondary | ICD-10-CM

## 2021-05-11 DIAGNOSIS — O99824 Streptococcus B carrier state complicating childbirth: Secondary | ICD-10-CM | POA: Diagnosis present

## 2021-05-11 DIAGNOSIS — F419 Anxiety disorder, unspecified: Secondary | ICD-10-CM | POA: Diagnosis present

## 2021-05-11 DIAGNOSIS — K219 Gastro-esophageal reflux disease without esophagitis: Secondary | ICD-10-CM | POA: Diagnosis present

## 2021-05-11 DIAGNOSIS — Z3A39 39 weeks gestation of pregnancy: Secondary | ICD-10-CM

## 2021-05-11 DIAGNOSIS — O99820 Streptococcus B carrier state complicating pregnancy: Secondary | ICD-10-CM

## 2021-05-11 DIAGNOSIS — O26893 Other specified pregnancy related conditions, third trimester: Secondary | ICD-10-CM | POA: Diagnosis present

## 2021-05-11 LAB — TYPE AND SCREEN
ABO/RH(D): B POS
Antibody Screen: NEGATIVE

## 2021-05-11 LAB — CBC
HCT: 39.5 % (ref 36.0–46.0)
Hemoglobin: 13.6 g/dL (ref 12.0–15.0)
MCH: 31.2 pg (ref 26.0–34.0)
MCHC: 34.4 g/dL (ref 30.0–36.0)
MCV: 90.6 fL (ref 80.0–100.0)
Platelets: 249 10*3/uL (ref 150–400)
RBC: 4.36 MIL/uL (ref 3.87–5.11)
RDW: 13.2 % (ref 11.5–15.5)
WBC: 9.2 10*3/uL (ref 4.0–10.5)
nRBC: 0 % (ref 0.0–0.2)

## 2021-05-11 LAB — RESP PANEL BY RT-PCR (FLU A&B, COVID) ARPGX2
Influenza A by PCR: NEGATIVE
Influenza B by PCR: NEGATIVE
SARS Coronavirus 2 by RT PCR: NEGATIVE

## 2021-05-11 LAB — RPR: RPR Ser Ql: NONREACTIVE

## 2021-05-11 LAB — POCT FERN TEST: POCT Fern Test: POSITIVE

## 2021-05-11 MED ORDER — IBUPROFEN 600 MG PO TABS
600.0000 mg | ORAL_TABLET | Freq: Four times a day (QID) | ORAL | Status: DC
Start: 2021-05-11 — End: 2021-05-13
  Administered 2021-05-11 – 2021-05-13 (×8): 600 mg via ORAL
  Filled 2021-05-11 (×8): qty 1

## 2021-05-11 MED ORDER — SOD CITRATE-CITRIC ACID 500-334 MG/5ML PO SOLN: 30.0000 mL | ORAL | Status: DC | PRN

## 2021-05-11 MED ORDER — DIPHENHYDRAMINE HCL 25 MG PO CAPS
25.0000 mg | ORAL_CAPSULE | Freq: Four times a day (QID) | ORAL | Status: DC | PRN
Start: 2021-05-11 — End: 2021-05-13

## 2021-05-11 MED ORDER — OXYTOCIN BOLUS FROM INFUSION
333.0000 mL | Freq: Once | INTRAVENOUS | Status: AC
  Administered 2021-05-11: 333 mL via INTRAVENOUS

## 2021-05-11 MED ORDER — SIMETHICONE 80 MG PO CHEW
80.0000 mg | CHEWABLE_TABLET | ORAL | Status: DC | PRN
  Filled 2021-05-11: qty 1

## 2021-05-11 MED ORDER — BENZOCAINE-MENTHOL 20-0.5 % EX AERO
1.0000 "application " | INHALATION_SPRAY | CUTANEOUS | Status: DC | PRN
  Administered 2021-05-11: 1 via TOPICAL
  Filled 2021-05-11: qty 56

## 2021-05-11 MED ORDER — ACETAMINOPHEN 325 MG PO TABS: 650.0000 mg | ORAL_TABLET | ORAL | Status: DC | PRN

## 2021-05-11 MED ORDER — LACTATED RINGERS IV SOLN: 500.0000 mL | INTRAVENOUS | Status: DC | PRN

## 2021-05-11 MED ORDER — PRENATAL MULTIVITAMIN CH
1.0000 | ORAL_TABLET | Freq: Every day | ORAL | Status: DC
Start: 2021-05-11 — End: 2021-05-13
  Administered 2021-05-11 – 2021-05-12 (×2): 1 via ORAL
  Filled 2021-05-11 (×2): qty 1

## 2021-05-11 MED ORDER — ONDANSETRON HCL 4 MG/2ML IJ SOLN
INTRAMUSCULAR | Status: AC
  Filled 2021-05-11: qty 2

## 2021-05-11 MED ORDER — LIDOCAINE HCL (PF) 1 % IJ SOLN: 30.0000 mL | INTRAMUSCULAR | Status: DC | PRN

## 2021-05-11 MED ORDER — FLUOXETINE HCL 20 MG PO CAPS
40.0000 mg | ORAL_CAPSULE | Freq: Every day | ORAL | Status: DC
  Administered 2021-05-11: 40 mg via ORAL
  Filled 2021-05-11: qty 2

## 2021-05-11 MED ORDER — ONDANSETRON HCL 4 MG PO TABS: 4.0000 mg | ORAL_TABLET | ORAL | Status: DC | PRN

## 2021-05-11 MED ORDER — VANCOMYCIN HCL IN DEXTROSE 1-5 GM/200ML-% IV SOLN
1000.0000 mg | INTRAVENOUS | Status: DC
Start: 2021-05-11 — End: 2021-05-11
  Administered 2021-05-11: 1000 mg via INTRAVENOUS
  Filled 2021-05-11: qty 200

## 2021-05-11 MED ORDER — ONDANSETRON HCL 4 MG/2ML IJ SOLN: 4.0000 mg | INTRAMUSCULAR | Status: DC | PRN

## 2021-05-11 MED ORDER — FLUOXETINE HCL 20 MG PO CAPS: 60.0000 mg | ORAL_CAPSULE | Freq: Every day | ORAL | Status: DC

## 2021-05-11 MED ORDER — COCONUT OIL OIL: 1.0000 "application " | TOPICAL_OIL | Status: DC | PRN

## 2021-05-11 MED ORDER — DIBUCAINE (PERIANAL) 1 % EX OINT: 1.0000 "application " | TOPICAL_OINTMENT | CUTANEOUS | Status: DC | PRN

## 2021-05-11 MED ORDER — WITCH HAZEL-GLYCERIN EX PADS: 1.0000 "application " | MEDICATED_PAD | CUTANEOUS | Status: DC | PRN

## 2021-05-11 MED ORDER — VANCOMYCIN HCL 10 G IV SOLR
2000.0000 mg | Freq: Once | INTRAVENOUS | Status: DC
  Filled 2021-05-11: qty 2000

## 2021-05-11 MED ORDER — OXYTOCIN-SODIUM CHLORIDE 30-0.9 UT/500ML-% IV SOLN
2.5000 [IU]/h | INTRAVENOUS | Status: DC
  Administered 2021-05-11: 2.5 [IU]/h via INTRAVENOUS
  Filled 2021-05-11: qty 500

## 2021-05-11 MED ORDER — ONDANSETRON HCL 4 MG/2ML IJ SOLN
4.0000 mg | Freq: Four times a day (QID) | INTRAMUSCULAR | Status: DC | PRN
  Administered 2021-05-11: 4 mg via INTRAVENOUS

## 2021-05-11 MED ORDER — SENNOSIDES-DOCUSATE SODIUM 8.6-50 MG PO TABS
2.0000 | ORAL_TABLET | Freq: Every day | ORAL | Status: DC
  Administered 2021-05-12: 2 via ORAL
  Filled 2021-05-11: qty 2

## 2021-05-11 MED ORDER — LACTATED RINGERS IV SOLN: INTRAVENOUS | Status: DC

## 2021-05-11 NOTE — Discharge Summary (Signed)
Postpartum Discharge Summary   Patient Name: Kimberly Rivera DOB: 1990/11/29 MRN: [id number]  Date of admission: 05/11/2021 Delivery date:05/11/2021  Delivering provider: Gaylan Gerold R  Date of discharge: 05/13/2021  Admitting diagnosis: Term birth of infant [Z37.0] Intrauterine pregnancy: [redacted]w[redacted]d    Secondary diagnosis:  Active Problems:   Term birth of infant  Additional problems: none    Discharge diagnosis: Term Pregnancy Delivered                                              Postpartum procedures: none Augmentation: N/A Complications: None  Hospital course: Onset of Labor With Vaginal Delivery      30y.o. yo [id number] 349w6das admitted in Active Labor on 05/11/2021. Patient had an uncomplicated labor course as follows:  Membrane Rupture Time/Date: 2:00 AM ,05/11/2021   Delivery Method:Vaginal, Spontaneous  Episiotomy: None  Lacerations:  None  Patient had an uncomplicated postpartum course.  She is ambulating, tolerating a regular diet, passing flatus, and urinating well. Patient is discharged home in stable condition on 05/13/21.  Newborn Data: Birth date:05/11/2021  Birth time:8:17 AM  Gender:Female  Living status:Living  Apgars:4 ,8  Weight:3655 g   Magnesium Sulfate received: No BMZ received: No Rhophylac:N/A MMR:No T-DaP:Given prenatally Flu: No Transfusion:No  Physical exam  Vitals:   05/12/21 0600 05/12/21 1359 05/12/21 2352 05/13/21 0551  BP: 104/65 117/65 109/60 108/68  Pulse: 65 64 67 64  Resp: _0 Temp: 97.7 F (36.5 C) 98 F (36.7 C) 97.8 F (36.6 C) 98.1 F (36.7 C)  TempSrc: Oral Oral Oral Oral  SpO2: 98% 99% 98% 100%  Weight:      Height:       General: alert, cooperative, and no distress Lochia: appropriate Uterine Fundus: firm Incision: N/A DVT Evaluation: No evidence of DVT seen on physical exam. Labs: Lab Results  Component Value Date   WBC 12.2 (H) 05/12/2021   HGB 12.9 05/12/2021   HCT 37.5 05/12/2021   MCV 91.5  05/12/2021   PLT 250 05/12/2021   No flowsheet data found. Edinburgh Score: Edinburgh Postnatal Depression Scale Screening Tool 05/11/2021  I have been able to laugh and see the funny side of things. 0  I have looked forward with enjoyment to things. 0  I have blamed myself unnecessarily when things went wrong. 1  I have been anxious or worried for no good reason. 2  I have felt scared or panicky for no good reason. 0  Things have been getting on top of me. 1  I have been so unhappy that I have had difficulty sleeping. 0  I have felt sad or miserable. 0  I have been so unhappy that I have been crying. 0  The thought of harming myself has occurred to me. 0  Edinburgh Postnatal Depression Scale Total 4     After visit meds:  Allergies as of 05/13/2021       Reactions   Augmentin [amoxicillin-pot Clavulanate] Hives   Codeine Hives        Medication List     STOP taking these medications    Diclegis 10-10 MG Tbec Generic drug: Doxylamine-Pyridoxine   famotidine 40 MG tablet Commonly known as: PEPCID   omeprazole 20 MG tablet Commonly known as: PriLOSEC OTC   ondansetron 8 MG disintegrating tablet Commonly known as:  Zofran ODT   PROBIOTIC PO       TAKE these medications    FLUoxetine 40 MG capsule Commonly known as: PROZAC Take 40 mg by mouth daily. What changed: Another medication with the same name was removed. Continue taking this medication, and follow the directions you see here.   ibuprofen 600 MG tablet Commonly known as: ADVIL Take 1 tablet (600 mg total) by mouth every 6 (six) hours.   prenatal multivitamin Tabs tablet Take 1 tablet by mouth daily at 12 noon.         Discharge home in stable condition Infant Feeding: Breast Infant Disposition:home with mother Discharge instruction: per After Visit Summary and Postpartum booklet. Activity: Advance as tolerated. Pelvic rest for 6 weeks.  Diet: routine diet Future Appointments: Future  Appointments  Date Time Provider [place]  06/08/2021  1:15 PM Gabriel Carina, CNM [facility]   Follow up Visit:  [place] for [place] at [facility] Follow up on 06/08/2021.   Specialty: Obstetrics and Gynecology Why: for postpartum appointment Contact information: [place] [phone]                Please schedule this patient for a In person postpartum visit in 4 weeks with the following provider: APP. Additional Postpartum F/U:Postpartum Depression checkup  Low risk pregnancy complicated by:  none Delivery mode:  Vaginal, Spontaneous  Anticipated Birth Control:   NFP/condoms   05/13/2021 Wende Mott, CNM

## 2021-05-11 NOTE — H&P (Addendum)
OBSTETRIC ADMISSION HISTORY AND PHYSICAL  Kimberly Rivera is a 30 y.o. female G44P2002 with IUP at [redacted]w[redacted]d by LMP presenting for ROM@0230 . She reports +FMs, no blurry vision, headaches or peripheral edema, and RUQ pain.  She plans on breast feeding. She requests FP for birth control. She received her prenatal care at  [facility]    Dating: By LMP --->  Estimated Date of Delivery: 05/12/21  Sono:    01/21/21@[redacted]w[redacted]d , CWD, normal anatomy, cephalic presentation,  692g, 02% EFW   Prenatal History/Complications:  Anxiety-Fluoxetine Asthma   Past Medical History: Past Medical History:  Diagnosis Date   Anxiety    Asthma    Headache    Hx of varicella    Postpartum care following vaginal delivery (8/10) 06/01/2016    Past Surgical History: Past Surgical History:  Procedure Laterality Date   KNEE SURGERY Right    TONSILLECTOMY     UMBILICAL HERNIA REPAIR      Obstetrical History: OB History     Gravida  3   Para  2   Term  2   Preterm  0   AB  0   Living  2      SAB  0   IAB  0   Ectopic  0   Multiple  0   Live Births  1           Social History Social History   Socioeconomic History   Marital status: Married    Spouse name: Koren Bound   Number of children: 2   Years of education: Not on file   Highest education level: Not on file  Occupational History   Occupation: Advertising account planner  Tobacco Use   Smoking status: Never   Smokeless tobacco: Never  Substance and Sexual Activity   Alcohol use: No    Alcohol/week: 0.0 standard drinks   Drug use: No   Sexual activity: Yes    Partners: Male    Comment: follow up with ob birth control  Other Topics Concern   Not on file  Social History Narrative   Not on file   Social Determinants of Health   Financial Resource Strain: Not on file  Food Insecurity: No Food Insecurity   Worried About Programme researcher, broadcasting/film/video in the Last Year: Never true   Ran Out of Food in the Last Year: Never true  Transportation Needs: No  Transportation Needs   Lack of Transportation (Medical): No   Lack of Transportation (Non-Medical): No  Physical Activity: Not on file  Stress: Not on file  Social Connections: Not on file    Family History: Family History  Problem Relation Age of Onset   Hypertension Mother    Anemia Mother    Hypertension Father    Hypertension Maternal Grandmother    Hypertension Maternal Grandfather    Diabetes Maternal Grandfather    Cancer - Lung Maternal Grandfather    Hypertension Paternal Grandmother    Hypertension Paternal Grandfather    Heart attack Maternal Uncle     Allergies: Allergies  Allergen Reactions   Augmentin [Amoxicillin-Pot Clavulanate] Hives   Codeine Hives    Pt denies allergies to latex, iodine, or shellfish.  Medications Prior to Admission  Medication Sig Dispense Refill Last Dose   famotidine (PEPCID) 40 MG tablet Take 40 mg by mouth 2 (two) times daily.   05/10/2021   FLUoxetine (PROZAC) 20 MG capsule Take by mouth.   05/10/2021   Prenatal Vit-Fe Fumarate-FA (PRENATAL MULTIVITAMIN) TABS tablet Take  1 tablet by mouth daily at 12 noon.   05/10/2021   Probiotic Product (PROBIOTIC PO) Take by mouth.   05/10/2021   DICLEGIS 10-10 MG TBEC    More than a month   FLUoxetine (PROZAC) 40 MG capsule Take 40 mg by mouth daily.      omeprazole (PRILOSEC OTC) 20 MG tablet Take 2 tablets (40 mg total) by mouth daily. 60 tablet 1 More than a month   ondansetron (ZOFRAN ODT) 8 MG disintegrating tablet Take 1 tablet (8 mg total) by mouth every 8 (eight) hours as needed for nausea or vomiting. 20 tablet 0 More than a month     Review of Systems   All systems reviewed and negative except as stated in HPI  Blood pressure (!) 111/56, pulse 79, temperature 98.7 F (37.1 C), resp. rate 18, height 5\' 6"  (1.676 m), weight 95.7 kg, last menstrual period 08/05/2020, SpO2 100 %, unknown if currently breastfeeding. General appearance: alert, cooperative, and appears stated age Lungs:  Normal WOB Abdomen: soft, non-tender; bowel sounds normal Pelvic: As stated above  Extremities: Homans sign is negative, no sign of DVT Presentation: cephalic Fetal monitoringBaseline: 120 bpm, Variability: Good {> 6 bpm), Accelerations: Reactive, and Decelerations: Absent Uterine activit: contractions q1-89min Dilation: 5 Effacement (%): 80 Station: -2 Exam by:: [email or web address], CNM  Prenatal labs: ABO, Rh:   Antibody:   Rubella: Immune (12/17 0000) RPR: Non Reactive (04/20 0858)  HBsAg: Negative (12/27 0000)  HIV: Non Reactive (04/20 0858)  GBS: Positive/-- (06/30 0945)  1 hr Glucola: passed Genetic screening:  nl Anatomy 07-27-1983: nl  Prenatal Transfer Tool  Maternal Diabetes: No Genetic Screening: Normal Maternal Ultrasounds/Referrals: Normal Fetal Ultrasounds or other Referrals:  None Maternal Substance Abuse:  No Significant Maternal Medications:  Fluoxetine  Significant Maternal Lab Results: Group B Strep positive  Results for orders placed or performed during the hospital encounter of 05/11/21 (from the past 24 hour(s))  CBC   Collection Time: 05/11/21  5:44 AM  Result Value Ref Range   WBC 9.2 4.0 - 10.5 K/uL   RBC 4.36 3.87 - 5.11 MIL/uL   Hemoglobin 13.6 12.0 - 15.0 g/dL   HCT 05/13/21 74.2 - 59.5 %   MCV 90.6 80.0 - 100.0 fL   MCH 31.2 26.0 - 34.0 pg   MCHC 34.4 30.0 - 36.0 g/dL   RDW 63.8 75.6 - 43.3 %   Platelets 249 150 - 400 K/uL   nRBC 0.0 0.0 - 0.2 %  Fern Test   Collection Time: 05/11/21  6:12 AM  Result Value Ref Range   POCT Fern Test Positive = ruptured amniotic membanes     Patient Active Problem List   Diagnosis Date Noted   Term birth of infant 05/11/2021   [redacted] weeks gestation of pregnancy 04/28/2021   Gastroesophageal reflux in pregnancy in third trimester 04/06/2021   Anxiety disorder affecting pregnancy, antepartum 01/06/2021   Supervision of low-risk pregnancy, third trimester 01/05/2021   Migraine 11/02/2014    Assessment/Plan:  Kimberly Rivera is a  30 y.o. G3P2002 at [redacted]w[redacted]d here for ROM@0230   #Labor: Pt desires a water birth. Will continue with expectant management as pt is progressing well.  #Pain: PRN #FWB: Cat 1 #ID: GBS+>Vanc (Allergy to PCN) #MOF: Breast #MOC: FP #Circ:  Yes #Anxiety: Fluoxetine 40mg  ordered  [redacted]w[redacted]d, MD  [facility], [facility] 05/11/2021, 6:28 AM  Midwife attestation: I have seen and examined this patient; I agree with above documentation in  the resident's note.   Kimberly Rivera is a 30 y.o. G3P3003 here for ROM and active labor at term.  PE: BP 116/60 (BP Location: Left Arm)   Pulse 72   Temp 97.9 F (36.6 C) (Oral)   Resp 16   Ht 5\' 6"  (1.676 m)   Wt 211 lb (95.7 kg)   LMP 08/05/2020   SpO2 98%   Breastfeeding Unknown   BMI 34.06 kg/m  Gen: calm comfortable, NAD Resp: normal effort, no distress Abd: gravid  ROS, labs, PMH reviewed  Plan: Admit to LD Labor: expectant management Fetal monitoring: intermittent following required FHR tracing, pt desires waterbirth ID: GBS positive  08/07/2020, CNM  05/12/2021, 5:12 AM

## 2021-05-11 NOTE — Lactation Note (Signed)
This note was copied from a baby's chart. Lactation Consultation Note  Patient Name: Kimberly Rivera Date: 05/11/2021   Age:29 hours  Per RN Mother has declined lactation services while her in hospital.  She has a Engineer, water per Lawyer.  Consult Status Consult Status: Complete (mother declined follow up)    Hardie Pulley 05/11/2021, 9:07 AM

## 2021-05-11 NOTE — Progress Notes (Signed)
Labor Progress Note Raygan Skarda is a 30 y.o. G3P2002 at [redacted]w[redacted]d presented for SROM and active labor  S:  Pt sitting in bed, very uncomfortable and nauseated with contractions. FOB, doula and sister-in-law at bedside for support. Wanting waterbirth but waiting on vanc to finish infusing.  O:  BP (!) 80/43   Pulse 98   Temp 97.6 F (36.4 C) (Oral)   Resp 16   Ht 5\' 6"  (1.676 m)   Wt 211 lb (95.7 kg)   LMP 08/05/2020   SpO2 100%   BMI 34.06 kg/m  EFM: baseline 130 bpm/ moderate variability/ 15x15 accels/ no decels  Toco/IUPC: q1.5-23min SVE: Dilation: 7 Effacement (%): 80 Cervical Position: Middle Station: -2 Presentation: Vertex Exam by:: H.Price, RN Pitocin: none needed  A/P: 30 y.o. G3P2002 [redacted]w[redacted]d  1. Labor: active to transition, progressing well without augmentation 2. FWB: Cat 1 3. Pain: Well-controlled with deep breathing, doula and FOB support 4. GBS positive: vancomycin infusing  Expectant management, anticipate SVD shortly.   [redacted]w[redacted]d, CNM 7:54 AM

## 2021-05-11 NOTE — MAU Note (Addendum)
Kimberly Rivera is a 30 y.o. at [redacted]w[redacted]d here in MAU reporting: cxt every 5-7 mins. Pt also states she has been leaking clear fluid since 0230 today. Pt reports a small amount of mucus/bloody discharge. +FM  Onset of complaint:  Pain score:  There were no vitals filed for this visit.   FHT: Lab orders placed from triage:

## 2021-05-12 ENCOUNTER — Inpatient Hospital Stay (HOSPITAL_COMMUNITY): Admit: 2021-05-12 | Payer: Self-pay

## 2021-05-12 LAB — CBC
HCT: 37.5 % (ref 36.0–46.0)
Hemoglobin: 12.9 g/dL (ref 12.0–15.0)
MCH: 31.5 pg (ref 26.0–34.0)
MCHC: 34.4 g/dL (ref 30.0–36.0)
MCV: 91.5 fL (ref 80.0–100.0)
Platelets: 250 10*3/uL (ref 150–400)
RBC: 4.1 MIL/uL (ref 3.87–5.11)
RDW: 13.3 % (ref 11.5–15.5)
WBC: 12.2 10*3/uL — ABNORMAL HIGH (ref 4.0–10.5)
nRBC: 0 % (ref 0.0–0.2)

## 2021-05-12 MED ORDER — FLUOXETINE HCL 20 MG PO CAPS: 40.0000 mg | ORAL_CAPSULE | Freq: Every day | ORAL | Status: DC

## 2021-05-12 NOTE — Social Work (Signed)
CSW received consult for hx of Anxiety and Depression.  CSW reviewed chart and notes a history of PPD. CSW met with Kimberly Rivera to offer support and complete assessment.     CSW introduced self and role. CSW observed FOB Wally present bedside and Kimberly Rivera feeding infant. Kimberly Rivera declined to have CSW return for assessment and stated FOB could remain in room. CSW informed Kimberly Rivera of reason for visit. Kimberly Rivera was understanding and reported she is currently doing well. Kimberly Rivera reported she was diagnosed with postpartum anxiety and depression in 2019, following the birth of her son. Kimberly Rivera stated the symptoms presented immediately following birth. CSW again asked Kimberly Rivera how she is currently feeling postpartum. Kimberly Rivera reported she is currently feeling well and not experiencing any PP mental health symptom. Kimberly Rivera stated she is being followed by [facility] and has been on Prozac for about a year, which is helpful. Kimberly Rivera shared she attended therapy for some time, however she eventually discontinued. CSW asked Kimberly Rivera if she has coping skills to address diagnosis. Kimberly Rivera reported she utilizes exercise and getting adequate Vitamin D. Kimberly Rivera stated she has a support system consisting of her spouse, in-laws and neighbors. Kimberly Rivera denies any current SI or HI.    CSW provided education regarding the baby blues period versus PPD. CSW provided the New Mom Checklist and encouraged Kimberly Rivera to self evaluate and contact a medical professional if symptoms are noted at any time.   CSW provided review of Sudden Infant Death Syndrome (SIDS) precautions. Kimberly Rivera has all needs for infant. Kimberly Rivera has identified a pediatrician and denies any barriers to care. Kimberly Rivera reported she has no additional needs at this time.   CSW identifies no further need for intervention and no barriers to discharge at this time.  Toben Acuna, LCSWA Clinical Social Work [facility] [phone]  

## 2021-05-12 NOTE — Progress Notes (Signed)
Post Partum Day #1 Subjective: no complaints, up ad lib, and tolerating PO; breastfeeding going well; prefers condoms/NFP for contraception; chart review shows she received one dose of PCN prior to delivery, so she will stay until the tomorrow as the baby will need to as well; she desires a circumcision for her son- consented and note placed in his chart  Objective: Blood pressure 104/65, pulse 65, temperature 97.7 F (36.5 C), temperature source Oral, resp. rate 16, height 5\' 6"  (1.676 m), weight 95.7 kg, last menstrual period 08/05/2020, SpO2 98 %, unknown if currently breastfeeding.  Physical Exam:  General: alert, cooperative, and no distress Lochia: appropriate Uterine Fundus: firm DVT Evaluation: No evidence of DVT seen on physical exam.  Recent Labs    05/11/21 0544 05/12/21 0448  HGB 13.6 12.9  HCT 39.5 37.5    Assessment/Plan: Plan for discharge tomorrow Infant for circ later today if possible   LOS: 1 day   05/14/21 CNM 05/12/2021, 9:48 AM

## 2021-05-13 MED ORDER — IBUPROFEN 600 MG PO TABS: 600.0000 mg | ORAL_TABLET | Freq: Four times a day (QID) | ORAL | 0 refills | Status: DC

## 2021-05-16 ENCOUNTER — Other Ambulatory Visit: Payer: Self-pay | Admitting: Certified Nurse Midwife

## 2021-05-16 DIAGNOSIS — N644 Mastodynia: Secondary | ICD-10-CM

## 2021-05-16 MED ORDER — NONFORMULARY OR COMPOUNDED ITEM: 1 refills | Status: DC

## 2021-05-16 NOTE — Progress Notes (Signed)
Pt breastfeeding - baby transferring well as evidenced by output, stool transition and weight gain. Pt experiencing much pain and abrasion from nursing on left side. Infant evaluated and found to have lip and posterior tongue ties. Pt educated on stretches and oral massages to help ease tightness of lip tissue, will take baby to be evaluated for revision by pediatric dentist if this does not resolve. All-Purpose Nipple Ointment (APNO) sent to compounding pharmacy.  Edd Arbour, CNM, MSN, IBCLC Certified Nurse Midwife, [facility]

## 2021-05-21 ENCOUNTER — Telehealth (HOSPITAL_COMMUNITY): Payer: Self-pay

## 2021-05-21 NOTE — Telephone Encounter (Signed)
No answer. Left message to return nurse call.  Marcelino Duster [facility] 05/21/2021,1452

## 2021-06-08 ENCOUNTER — Other Ambulatory Visit: Payer: Self-pay

## 2021-06-08 ENCOUNTER — Encounter: Payer: Self-pay | Admitting: Certified Nurse Midwife

## 2021-06-08 ENCOUNTER — Ambulatory Visit (INDEPENDENT_AMBULATORY_CARE_PROVIDER_SITE_OTHER): Payer: 59 | Admitting: Certified Nurse Midwife

## 2021-06-08 DIAGNOSIS — F411 Generalized anxiety disorder: Secondary | ICD-10-CM

## 2021-06-08 NOTE — Progress Notes (Signed)
Post Partum Visit Note  Kimberly Rivera is a 30 y.o. G82P3003 female who presents for a postpartum visit. She is 4 weeks postpartum following a normal spontaneous vaginal delivery.  I have fully reviewed the prenatal and intrapartum course. The delivery was at [redacted]w[redacted]d gestational weeks.  Anesthesia: none. Postpartum course has been uncomplicated and well supported. Baby is doing well. Baby is feeding by breast. Bleeding staining only. Bowel function is normal. Bladder function is normal. Patient is not sexually active. Contraception method is condoms. Postpartum depression screening: negative.   Upstream - 06/10/21 0810       Pregnancy Intention Screening   Does the patient want to become pregnant in the next year? No    Does the patient's partner want to become pregnant in the next year? No    Would the patient like to discuss contraceptive options today? No      Contraception Wrap Up   Current Method Female Condom;FAM or LAM    End Method FAM or LAM;Female Condom    Contraception Counseling Provided Yes            The pregnancy intention screening data noted above was reviewed. Potential methods of contraception were discussed. The patient elected to proceed with FAM or LAM; Female Condom.   Edinburgh Postnatal Depression Scale - 06/08/21 1334       Edinburgh Postnatal Depression Scale:  In the Past 7 Days   I have been able to laugh and see the funny side of things. 0    I have looked forward with enjoyment to things. 0    I have blamed myself unnecessarily when things went wrong. 0    I have been anxious or worried for no good reason. 0    I have felt scared or panicky for no good reason. 0    Things have been getting on top of me. 0    I have been so unhappy that I have had difficulty sleeping. 0    I have felt sad or miserable. 0    I have been so unhappy that I have been crying. 0    The thought of harming myself has occurred to me. 0    Edinburgh Postnatal Depression  Scale Total 0            Health Maintenance Due  Topic Date Due   COVID-19 Vaccine (1) Never done   Hepatitis C Screening  Never done   INFLUENZA VACCINE  05/23/2021    The following portions of the patient's history were reviewed and updated as appropriate: allergies, current medications, past family history, past medical history, past social history, past surgical history, and problem list.  Review of Systems Pertinent items noted in HPI and remainder of comprehensive ROS otherwise negative.  Objective:  BP 119/79   Pulse 71   Wt 196 lb 1.6 oz (89 kg)   Breastfeeding Yes   BMI 31.65 kg/m    General:  alert, cooperative, appears stated age, and no distress   Breasts:  normal  Lungs: Normal effort  Heart:  regular rate and rhythm  Abdomen: Soft, non-tender    Wound N/A  GU exam:  not indicated       Assessment:  Postpartum care and examination of lactating mother  Plan:   Essential components of care per ACOG recommendations:  1.  Mood and well being: Patient with negative depression screening today. Has a history of severe PPD, but is on medication and followed closely  by her psychiatrist, also has strong familial support and has enlisted the help of postpartum doulas. Reviewed local resources for support.  - Patient tobacco use? No.   - hx of drug use? No.    2. Infant care and feeding:  -Patient currently breastmilk feeding? Yes. Discussed returning to work and pumping. Reviewed importance of draining breast regularly to support lactation.  -Social determinants of health (SDOH) reviewed in EPIC. No concerns   3. Sexuality, contraception and birth spacing - Patient does not want a pregnancy in the next year.  Desired family size is 3-4 children. Currently undecided about a 4th child. - Reviewed forms of contraception in tiered fashion. Patient desired condoms, natural family planning (NFP) today. She and partner are experienced in this method and have not had  unintended pregnancies using NFP and condoms. - Discussed birth spacing of 18 months  4. Sleep and fatigue -Encouraged family/partner/community support of 4 hrs of uninterrupted sleep to help with mood and fatigue  5. Physical Recovery  - Discussed patients delivery and complications. She describes her labor as good. - Patient had a Vaginal, no problems at delivery. Patient had  no  laceration. Perineal healing reviewed. Patient expressed understanding - Patient has urinary incontinence? No. - Patient is safe to resume physical and sexual activity when ready  6.  Health Maintenance - HM due items addressed Yes - Last pap smear 10/14/20. Results were normal. Pap smear not done at today's visit.  -Breast Cancer screening indicated? No.   7. Chronic Disease/Pregnancy Condition follow up: None - PCP follow up as needed  Bernerd Limbo, CNM Center for [employer], [facility]

## 2021-06-10 ENCOUNTER — Encounter: Payer: Self-pay | Admitting: Certified Nurse Midwife

## 2021-06-10 DIAGNOSIS — F411 Generalized anxiety disorder: Secondary | ICD-10-CM | POA: Insufficient documentation

## 2021-06-28 ENCOUNTER — Other Ambulatory Visit: Payer: Self-pay

## 2021-06-28 DIAGNOSIS — R102 Pelvic and perineal pain: Secondary | ICD-10-CM

## 2021-06-28 DIAGNOSIS — O9089 Other complications of the puerperium, not elsewhere classified: Secondary | ICD-10-CM

## 2021-06-28 NOTE — Progress Notes (Signed)
Pelvic PT referral requested by patient due to ongoing perineal soreness postpartum. Verbal order received from Edd Arbour, CNM. Referral placed. Patient notified she will be called with appt.  Fleet Contras RN

## 2021-09-08 ENCOUNTER — Ambulatory Visit: Payer: 59 | Admitting: Physical Therapy

## 2021-09-22 ENCOUNTER — Ambulatory Visit: Payer: 59 | Admitting: Physical Therapy

## 2021-09-23 ENCOUNTER — Telehealth (INDEPENDENT_AMBULATORY_CARE_PROVIDER_SITE_OTHER): Payer: 59 | Admitting: Certified Nurse Midwife

## 2021-09-23 ENCOUNTER — Ambulatory Visit: Payer: 59

## 2021-09-23 DIAGNOSIS — B349 Viral infection, unspecified: Secondary | ICD-10-CM

## 2021-09-23 DIAGNOSIS — H679 Otitis media in diseases classified elsewhere, unspecified ear: Secondary | ICD-10-CM

## 2021-09-23 MED ORDER — PREDNISONE 10 MG PO TABS: 10.0000 mg | ORAL_TABLET | Freq: Every day | ORAL | 1 refills | Status: DC

## 2021-09-23 NOTE — Telephone Encounter (Signed)
    TELEHEALTH OBSTETRICS VISIT ENCOUNTER NOTE  Provider location: [facility] [employer], but working remotely  Patient location: Home  I connected with Crissie Figures on 09/23/21 at 4:53 PM by telephone at home and verified that I am speaking with the correct person using two identifiers. Of note, unable to do video encounter due to technical difficulties.    I discussed the limitations, risks, security and privacy concerns of performing an evaluation and management service by telephone and the availability of in person appointments. I also discussed with the patient that there may be a patient responsible charge related to this service. The patient expressed understanding and agreed to proceed.  Subjective:  Kimberly Rivera is a 30 y.o. [phone] non-pregnant patient calling in with severe ear pain after all 3 of her children had viral ear infections last week. Currently reporting pain, nasal congestion and tearing on the same side as well as mild hearing loss. She has been treated in the past for ear infections with antibiotics but she does not get relief until on a round of steroids to clear the inflammation. Wants to avoid antibiotics this time.  Has Migraine; Generalized anxiety disorder; and Lactating mother on their problem list.   The following portions of the patient's history were reviewed and updated as appropriate: allergies, current medications, past family history, past medical history, past social history, past surgical history and problem list.   Objective:   General:  Alert, oriented and cooperative.   Mental Status: Normal mood and affect perceived. Normal judgment and thought content.  Rest of physical exam deferred due to type of encounter  Assessment and Plan:  Pregnancy: G3P3003 at Unknown 1. Viral ear infection, unspecified laterality - 10mg  oral prednisone sent to pharmacy. Take daily for up to 5 days with breakfast.  I discussed the assessment and  treatment plan with the patient. The patient was provided an opportunity to ask questions and all were answered. The patient agreed with the plan and demonstrated an understanding of the instructions. The patient was advised to call back or seek an in-person office/urgent care evaluation. Please refer to After Visit Summary for other counseling recommendations.   I provided 10 minutes of non-face-to-face time during this encounter.  Will follow up with PCP if ear pain does not resolve after short course of low dose steroid treatment.  , CNM [facility], [facility]

## 2021-09-29 ENCOUNTER — Ambulatory Visit: Payer: 59 | Attending: Certified Nurse Midwife | Admitting: Physical Therapy

## 2021-09-29 ENCOUNTER — Encounter: Payer: Self-pay | Admitting: Physical Therapy

## 2021-09-29 ENCOUNTER — Other Ambulatory Visit: Payer: Self-pay

## 2021-09-29 DIAGNOSIS — R252 Cramp and spasm: Secondary | ICD-10-CM | POA: Insufficient documentation

## 2021-09-29 DIAGNOSIS — M6281 Muscle weakness (generalized): Secondary | ICD-10-CM | POA: Diagnosis not present

## 2021-09-29 NOTE — Therapy (Signed)
[facility] @ [facility] [address] [place], [place], [address] Phone: [phone]   Fax:  [phone]  Physical Therapy Evaluation  Patient Details  Name: Kimberly Rivera MRN: [id number] Date of Birth: April 04, 1991 Referring Provider (PT): Bernerd Limbo, [place]   Encounter Date: 09/29/2021   PT End of Session - 09/29/21 1644     Visit Number 1    Date for PT Re-Evaluation 12/22/21    PT Start Time 1152    PT Stop Time 1225    PT Time Calculation (min) 33 min    Activity Tolerance Patient tolerated treatment well    Behavior During Therapy [facility] for tasks assessed/performed             Past Medical History:  Diagnosis Date   Anxiety    Asthma    Headache    Hx of varicella    Postpartum care following vaginal delivery (8/10) 06/01/2016   Supervision of low-risk pregnancy, third trimester 01/05/2021    Nursing Staff Provider Office Location [facility] Dating  LMP Language  English Anatomy US   Flu Vaccine  October 2021 Genetic/Carrier Screen  NIPS:    AFP:    Horizon: TDaP Vaccine  04/13/21 Hgb A1C or  GTT Early  Third trimester  COVID Vaccine x2 needs booster   LAB RESULTS  Rhogam  NA Blood Type  B pos Baby Feeding Plan Breast Antibody   Contraception Family Planning Rubella   Circumcision Yes- IP RPR    Past Surgical History:  Procedure Laterality Date   KNEE SURGERY Right    TONSILLECTOMY     UMBILICAL HERNIA REPAIR      There were no vitals filed for this visit.    Subjective Assessment - 09/29/21 1157     Subjective I feel the soreness when I do a lot since vaginal delivery 04/11/21.  This is 3rd child vaginal delivery.  There was no tearing    Currently in Pain? Yes    Pain Score 6     Pain Location Perineum    Pain Descriptors / Indicators Sore    Pain Type Post Delivery (post partum)    Pain Radiating Towards radiates to the rectum    Pain Onset More than a month ago    Pain Frequency Intermittent    Aggravating  Factors  doing a lot and standing    Pain Relieving Factors resting    Multiple Pain Sites No                [facility] PT Assessment - 09/30/21 0001       Assessment   Medical Diagnosis O90.89,R10.2 (ICD-10-CM) - Postpartum perineal pain    Referring Provider (PT) Bernerd Limbo, CNM    Onset Date/Surgical Date --   04/11/21   Prior Therapy No      Precautions   Precautions None      Balance Screen   Has the patient fallen in the past 6 months No      Home Environment   Living Environment Private residence    Living Arrangements Spouse/significant other;Children   3 children     Prior Function   Level of Independence Independent    Vocation Part time employment    Presenter, broadcasting at [employer]    Leisure being active and jogging      Observation/Other Assessments   Observations Rt medial arch collapse      Posture/Postural Control  Posture/Postural Control Postural limitations    Postural Limitations Anterior pelvic tilt;Increased lumbar lordosis    Posture Comments abdominal weakness and lumbar tight      ROM / Strength   AROM / PROM / Strength AROM;PROM;Strength      AROM   Overall AROM Comments Lumbar ROM normal      PROM   Overall PROM Comments hip IR Lt 50%      Strength   Overall Strength Comments Rt abduction 4/5      Flexibility   Soft Tissue Assessment /Muscle Length yes    Hamstrings normal      Ambulation/Gait   Gait Pattern Within Functional Limits                        Objective measurements completed on examination: See above findings.     Pelvic Floor Special Questions - 09/30/21 0001     Prior Pelvic/Prostate Exam Yes    Are you Pregnant or attempting pregnancy? No    Prior Pregnancies Yes    Number of Pregnancies 3    Number of Vaginal Deliveries 3    Currently Sexually Active Yes    Is this Painful Yes    Marinoff Scale pain interrupts completion    Urinary Leakage Yes    How  often rarely - a few times per month    Activities that cause leaking Coughing;Sneezing    Urinary urgency No    Urinary frequency normal    Falling out feeling (prolapse) No    Skin Integrity Intact    External Palpation normal    Prolapse None    Pelvic Floor Internal Exam pt identity confirmed and informed consent to do internal assessment was given    Exam Type Vaginal    Palpation TTP levators Rt side; tight throughout    Strength weak squeeze, no lift    Strength # of reps 3    Strength # of seconds 2    Tone high                       PT Education - 09/29/21 1645     Education Details educated on how to do self stretch to vaginal canal/levators    Person(s) Educated Patient    Methods Explanation    Comprehension Verbalized understanding              PT Short Term Goals - 09/30/21 0803       PT SHORT TERM GOAL #1   Title ind with perineal massage    Time 4    Period Weeks    Status New    Target Date 10/27/21               PT Long Term Goals - 09/30/21 0753       PT LONG TERM GOAL #1   Title Pt will be ind with HEP    Time 12    Period Weeks    Status New    Target Date 12/22/21      PT LONG TERM GOAL #2   Time 12    Period Weeks    Status New    Target Date 12/22/21      PT LONG TERM GOAL #3   Title Pt will experience at least 75% less pelvic soreness at the end of the day due to improved pelvic muscle tone    Time 12    Period  Weeks    Status New    Target Date 12/22/21      PT LONG TERM GOAL #4   Title Pt will be able to jog for 30 second intervals in order to transition back to running for exercise    Time 12    Period Weeks    Status New    Target Date 12/22/21                    Plan - 09/29/21 1228     Clinical Impression Statement Pt presents to skilled PT due to perineal soreness since most recent delivery.  Pt demonstrates a significant anterior pelvic tilt.  She has Lt medial arch collapse.  Lt  IR is stiff and limited ROM by 50%.  Pt has ultimately high tone pelvic floor that is more tight on the Rt side and more tender on the Rt side. Pt was weak and low endurance secondary to tight muscles.  Pt able to do 3 quick flicks at 2/5 MMT and 2 second hold at the most with kegel. Pt will benefit from skilled PT to address impairments for improved pelvic floor function and pain management    Personal Factors and Comorbidities Comorbidity 1    Comorbidities 3 vaginal deliveries    PT Frequency 1x / week    PT Duration 8 weeks    PT Treatment/Interventions ADLs/Self Care Home Management;Biofeedback;Cryotherapy;Electrical Stimulation;Moist Heat;Therapeutic exercise;Neuromuscular re-education;Manual techniques;Scar mobilization;Patient/family education;Dry needling;Passive range of motion;Therapeutic activities;Taping    PT Next Visit Plan internal STM and breathing with bulging, pelvic floor stretches; f/u on self stretching muscles    Consulted and Agree with Plan of Care Patient             Patient will benefit from skilled therapeutic intervention in order to improve the following deficits and impairments:  Pain, Postural dysfunction, Increased fascial restricitons, Decreased strength, Decreased endurance, Increased muscle spasms  Visit Diagnosis: Muscle weakness (generalized)  Cramp and spasm     Problem List Patient Active Problem List   Diagnosis Date Noted   Generalized anxiety disorder 06/10/2021   Lactating mother 06/10/2021   Migraine 11/02/2014    Junious Silk, PT 09/30/2021, 8:10 AM  [facility] @ [facility] [address] [place], [place], [address] Phone: [phone]   Fax:  [phone]  Name: Kimberly Rivera MRN: [id number] Date of Birth: 1991-07-08

## 2021-10-06 ENCOUNTER — Encounter: Payer: 59 | Admitting: Physical Therapy

## 2021-10-11 ENCOUNTER — Ambulatory Visit: Payer: 59 | Admitting: Physical Therapy

## 2022-01-05 ENCOUNTER — Telehealth: Payer: Self-pay | Admitting: Certified Nurse Midwife

## 2022-01-05 MED ORDER — ONDANSETRON 8 MG PO TBDP: 8.0000 mg | ORAL_TABLET | Freq: Three times a day (TID) | ORAL | 0 refills | Status: DC | PRN

## 2022-01-05 NOTE — Telephone Encounter (Signed)
Pt has had a stomach virus passed to her via her children, was getting better but had a resurgence overnight and requests a work note and antiemetic. Both have been sent and pt advised to work on hydration today, present to a local ED if she starts feeling dizzy, weak or lightheaded. ? ?Edd Arbour, CNM, MSN, IBCLC ?Certified Nurse Midwife, [facility] ? ?  ?

## 2022-03-02 ENCOUNTER — Telehealth: Payer: Self-pay | Admitting: Certified Nurse Midwife

## 2022-03-02 DIAGNOSIS — F411 Generalized anxiety disorder: Secondary | ICD-10-CM
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
## 2022-03-06 ENCOUNTER — Ambulatory Visit (INDEPENDENT_AMBULATORY_CARE_PROVIDER_SITE_OTHER): Payer: 59 | Admitting: Licensed Clinical Social Worker

## 2022-03-06 DIAGNOSIS — F411 Generalized anxiety disorder: Secondary | ICD-10-CM

## 2022-03-07 NOTE — BH Specialist Note (Signed)
Integrated Behavioral Health via Telemedicine Visit ? ?03/07/2022 ?Meriel Madhuri Vacca ?[id number] ? ?Number of Integrated Behavioral Health Clinician visits: 1 ?Session Start time: 11:03am  ?Session End time: 11:47am ?Total time in minutes: 44 mins via mychart video  ? ?Referring Provider: Tyler Aas CNM ?Patient/Family location: Home  ?[facility] Provider location: [facility]  ?All persons participating in visit: Pt I Lisa and LCSW A. Felton Clinton  ?Types of Service: Individual psychotherapy ? ?I connected with Orchid Colletta [place] and/or Bradleigh Danielle Leask's n/a via  Telephone or Video Enabled Telemedicine Application  (Video is Caregility application) and verified that I am speaking with the correct person using two identifiers. Discussed confidentiality: Yes  ? ?I discussed the limitations of telemedicine and the availability of in person appointments.  Discussed there is a possibility of technology failure and discussed alternative modes of communication if that failure occurs. ? ?I discussed that engaging in this telemedicine visit, they consent to the provision of behavioral healthcare and the services will be billed under their insurance. ? ?Patient and/or legal guardian expressed understanding and consented to Telemedicine visit: Yes  ? ?Presenting Concerns: ?Patient and/or family reports the following symptoms/concerns: general anxiety disorder  ?Duration of problem: over one year; Severity of problem: mild ? ?Patient and/or Family's Strengths/Protective Factors: ?Concrete supports in place (healthy food, safe environments, etc.) ? ?Goals Addressed: ?Patient will: ? Reduce symptoms of: anxiety  ? Increase knowledge and/or ability of: coping skills and stress reduction  ? Demonstrate ability to: Increase healthy adjustment to current life circumstances ? ?Progress towards Goals: ?Ongoing ? ?Interventions: ?Interventions utilized:  Supportive Counseling ?Standardized Assessments completed: PHQ 9 ? ?Patient and/or Family  Response: Ms. Kiel responded well to mychart visit  ? ?Assessment: ?Patient currently experiencing general anxiety disorder.  ? ?Patient may benefit from integrated behavioral health. ? ?Plan: ?Follow up with behavioral health clinician on : 03/27/2022 ?Behavioral recommendations: Create boundaries with extended family members, prioritize self care and delegate task to reduce burnout, mediation and relaxation techniques ?Referral(s): Integrated [employer] (In Clinic) ? ?I discussed the assessment and treatment plan with the patient and/or parent/guardian. They were provided an opportunity to ask questions and all were answered. They agreed with the plan and demonstrated an understanding of the instructions. ?  ?They were advised to call back or seek an in-person evaluation if the symptoms worsen or if the condition fails to improve as anticipated. ? ?Gwyndolyn Saxon, LCSW ?

## 2022-03-27 ENCOUNTER — Encounter: Payer: 59 | Admitting: Licensed Clinical Social Worker

## 2022-03-30 ENCOUNTER — Encounter: Payer: 59 | Admitting: Licensed Clinical Social Worker

## 2022-04-10 ENCOUNTER — Encounter: Payer: 59 | Admitting: Licensed Clinical Social Worker

## 2022-04-14 ENCOUNTER — Encounter: Payer: 59 | Admitting: Licensed Clinical Social Worker

## 2022-12-22 ENCOUNTER — Telehealth (INDEPENDENT_AMBULATORY_CARE_PROVIDER_SITE_OTHER): Payer: 59 | Admitting: Certified Nurse Midwife

## 2022-12-22 DIAGNOSIS — F329 Major depressive disorder, single episode, unspecified: Secondary | ICD-10-CM | POA: Diagnosis not present

## 2022-12-22 DIAGNOSIS — F411 Generalized anxiety disorder: Secondary | ICD-10-CM

## 2022-12-22 DIAGNOSIS — F4321 Adjustment disorder with depressed mood: Secondary | ICD-10-CM

## 2022-12-22 NOTE — Progress Notes (Signed)
GYNECOLOGY VIRTUAL VISIT ENCOUNTER NOTE  Provider location: [facility]   Patient location: Home  I connected with Kimberly Rivera on 12/22/22 at  8:15 AM EST by MyChart Video Encounter and verified that I am speaking with the correct person using two identifiers.   I discussed the limitations, risks, security and privacy concerns of performing an evaluation and management service virtually and the availability of in person appointments. I also discussed with the patient that there may be a patient responsible charge related to this service. The patient expressed understanding and agreed to proceed.   History:  Kimberly Rivera is a 32 y.o. G10P3003 female being evaluated today to discuss mental health assistance. [facility] is her only current provider and she needs referrals for [facility], psychiatry and to restart anxiety/depression meds. Pt recently lost her 2nd parent figure in the last two years, has a strong history of anxiety/depression and is experiencing a recurrence of both. Prior to her grandfather's death, she had been able to wean off meds and was feeling well. His death has triggered the following symptoms: excessive crying, can sleep at night but is unmotivated during the day, has racing thoughts, brain fog, and suicidal ideation (does not want to be here but no intent due to her kids).  Was previously on '80mg'$  prozac but felt that made her too numb, had considered adjunct therapy with 150 wellbutrin but was unsure at the time. Wants to discuss restarting one or both.  No physical complaints at this time but will want some bloodwork once her grief settles a bit.   Past Medical History:  Diagnosis Date   Anxiety    Asthma    Headache    Hx of varicella    Postpartum care following vaginal delivery (8/10) 06/01/2016   Supervision of low-risk pregnancy, third trimester 01/05/2021    Nursing Staff Provider Office Location [facility] Dating  LMP  Language  English Anatomy US   Flu Vaccine  October 2021 Genetic/Carrier Screen  NIPS:    AFP:    Horizon: TDaP Vaccine  04/13/21 Hgb A1C or  GTT Early  Third trimester  COVID Vaccine x2 needs booster   LAB RESULTS  Rhogam  NA Blood Type  B pos Baby Feeding Plan Breast Antibody   Contraception Family Planning Rubella   Circumcision Yes- IP RPR   Past Surgical History:  Procedure Laterality Date   KNEE SURGERY Right    TONSILLECTOMY     UMBILICAL HERNIA REPAIR     The following portions of the patient's history were reviewed and updated as appropriate: allergies, current medications, past family history, past medical history, past social history, past surgical history and problem list.   Health Maintenance:  Normal pap and negative HRHPV on 10/14/20.   Review of Systems:  Pertinent items noted in HPI and remainder of comprehensive ROS otherwise negative.  Physical Exam:   General:  Alert, oriented and cooperative. Patient appears to be in no acute distress.  Mental Status: Depressed mood but normal affect and behavior. Good insight into mental health status. Normal judgment and thought content.   Respiratory: Normal respiratory effort, no problems with respiration noted  Rest of physical exam deferred due to type of encounter  Labs and Imaging No results found for this or any previous visit (from the past 336 hour(s)). No results found.     Assessment and Plan:  Complicated grief - Ambulatory referral to [place]  quick turnaround for appointment, pt unlikely to leave kids to go to [facility]. Roselyn Reef able to get pt scheduled for virtual appt on Monday. Pt amenable.  Generalized anxiety disorder - Has meds on hand and a refill for wellbutrin. Is interested in trying the prozac again at a lower dose and the combination of wellbutrin.  - Will begin '40mg'$  prozac and '150mg'$  wellbutrin today.  - Agrees to close follow up with [facility], psychiatry referral  placed.  Reactive depression - Discussed current state of suicidal ideation. Pt has good awareness and insight, has passing thoughts of not wanting to be here or experience her grief but will not leave her children without a mother. - Knows about [facility], has excellent family and friend support including two who are aware of her current state of mind. Encouraged to stay in touch with supports and be honest if additional support is warranted.  I discussed the assessment and treatment plan with the patient. The patient was provided an opportunity to ask questions and all were answered. The patient agreed with the plan and demonstrated an understanding of the instructions.   The patient was advised to call back or seek an in-person evaluation/go to the ED if the symptoms worsen or if the condition fails to improve as anticipated.  I provided 20 minutes of face-to-face time during this encounter.  Kimberly Rivera, [place] for [employer], [place]

## 2022-12-22 NOTE — Addendum Note (Signed)
Addended by: Gaylan Gerold on: 12/22/2022 09:20 PM   Modules accepted: Orders

## 2022-12-25 ENCOUNTER — Ambulatory Visit (INDEPENDENT_AMBULATORY_CARE_PROVIDER_SITE_OTHER): Payer: 59 | Admitting: Clinical

## 2022-12-25 DIAGNOSIS — F4321 Adjustment disorder with depressed mood: Secondary | ICD-10-CM

## 2022-12-25 DIAGNOSIS — F411 Generalized anxiety disorder: Secondary | ICD-10-CM

## 2022-12-25 DIAGNOSIS — F332 Major depressive disorder, recurrent severe without psychotic features: Secondary | ICD-10-CM

## 2022-12-25 NOTE — Patient Instructions (Signed)
[facility] [address] [place], [place] [address] [phone] (main office) [phone] (Marrian Bells's office)  /Emotional Wellbeing Apps and Websites Here are a few free apps meant to help you to help yourself.  To find, try searching on the internet to see if the app is offered on Apple/Android devices. If your first choice doesn't come up on your device, the good news is that there are many choices! Play around with different apps to see which ones are helpful to you.    Calm This is an app meant to help increase calm feelings. Includes info, strategies, and tools for tracking your feelings.      Calm Harm  This app is meant to help with self-harm. Provides many 5-minute or 15-min coping strategies for doing instead of hurting yourself.       Healthy Minds Health Minds is a problem-solving tool to help deal with emotions and cope with stress you encounter wherever you are.      MindShift This app can help people cope with anxiety. Rather than trying to avoid anxiety, you can make an important shift and face it.      MY3  MY3 features a support system, safety plan and resources with the goal of offering a tool to use in a time of need.       My Life My Voice  This mood journal offers a simple solution for tracking your thoughts, feelings and moods. Animated emoticons can help identify your mood.       Relax Melodies Designed to help with sleep, on this app you can mix sounds and meditations for relaxation.      Smiling Mind Smiling Mind is meditation made easy: it's a simple tool that helps put a smile on your mind.        Stop, Breathe & Think  A friendly, simple guide for people through meditations for mindfulness and compassion.  Stop, Breathe and Think Kids Enter your current feelings and choose a "mission" to help you cope. Offers videos for certain moods instead of just sound recordings.       Team  Orange The goal of this tool is to help teens change how they think, act, and react. This app helps you focus on your own good feelings and experiences.      The Virtual Hope Box The Virtual Hope Box (VHB) contains simple tools to help patients with coping, relaxation, distraction, and positive thinking.     

## 2022-12-25 NOTE — BH Specialist Note (Signed)
Integrated Behavioral Health via Telemedicine Visit  12/25/2022 Kimberly Rivera HT:[id number]  Number of [facility] Clinician visits: 1- Initial Visit  Session Start time: 1209   Session End time: 1250  Total time in minutes: 41   Referring Provider: Gaylan Gerold, CNM Patient/Family location: Home [name] Provider location: [facility] [place] [facility]  All persons participating in visit: Patient Kimberly Rivera and [name]   Types of Service: Individual psychotherapy and Video visit  I connected with Kimberly Rivera and/or Kimberly Rivera's  n/a  via  Telephone or Video Enabled Telemedicine Application  (Video is Caregility application) and verified that I am speaking with the correct person using two identifiers. Discussed confidentiality: Yes   I discussed the limitations of telemedicine and the availability of in person appointments.  Discussed there is a possibility of technology failure and discussed alternative modes of communication if that failure occurs.  I discussed that engaging in this telemedicine visit, they consent to the provision of behavioral healthcare and the services will be billed under their insurance.  Patient and/or legal guardian expressed understanding and consented to Telemedicine visit: Yes   Presenting Concerns: Patient and/or family reports the following symptoms/concerns: Recent increase in depression and anxiety with passive SI (no intent and no plan), along with recent loss of grandfather. Pt copes best when she feels heard, as well as spending time outdoors; open to implementing self-coping strategy to help with worry preventing quality sleep.  Duration of problem: Ongoing; Severity of problem: severe  Patient and/or Family's Strengths/Protective Factors: Social connections, Concrete supports in place (healthy food, safe environments, etc.), Sense of purpose, and Physical Health  (exercise, healthy diet, medication compliance, etc.)  Goals Addressed: Patient will:  Reduce symptoms of: anxiety, depression, and stress   Increase knowledge and/or ability of: self-management skills   Demonstrate ability to: Increase healthy adjustment to current life circumstances  Progress towards Goals: Ongoing  Interventions: Interventions utilized:  CBT Cognitive Behavioral Therapy and Psychoeducation and/or Health Education Standardized Assessments completed: C-SSRS Short, GAD-7, and PHQ 9  Patient and/or Family Response: Patient agrees with treatment plan.   Assessment: Patient currently experiencing Grief, Major depressive disorder, recurrent, severe without psychotic features and Generalized anxiety disorder.   Patient may benefit from psychoeducation and brief therapeutic interventions regarding coping with symptoms of depression and anxiety .  Plan: Follow up with behavioral health clinician on : Two weeks Behavioral recommendations:  -Continue taking Prozac and Wellbutrin as prescribed -Begin Worry Time strategy, as discussed. Start by setting up start and end time reminders on phone today; continue daily for two weeks.  Referral(s): [place] [facility] (In Clinic)  I discussed the assessment and treatment plan with the patient and/or parent/guardian. They were provided an opportunity to ask questions and all were answered. They agreed with the plan and demonstrated an understanding of the instructions.   They were advised to call back or seek an in-person evaluation if the symptoms worsen or if the condition fails to improve as anticipated.  [place], LCSW     12/25/2022   12:14 PM 06/08/2021    1:34 PM 05/04/2021   11:54 AM 04/28/2021    5:32 PM 04/21/2021   10:28 AM  Depression screen PHQ 2/9  Decreased Interest '3 1 1 1 1  '$ Down, Depressed, Hopeless '3 1 1 1 1  '$ PHQ - 2 Score '6 2 2 2 2  '$ Altered sleeping 3 0 1 1 1  Tired, decreased  energy '3 1 1 1 1  '$ Change in appetite 3 0 0 0 0  Feeling bad or failure about yourself  3 0 '1 1 1  '$ Trouble concentrating 3 0 0 0 1  Moving slowly or fidgety/restless 1 0 0 0 0  Suicidal thoughts 1 0 0 0 0  PHQ-9 Score '23 3 5 5 6      '$ 12/25/2022   12:16 PM 06/08/2021    1:34 PM 05/04/2021   11:54 AM 04/28/2021    5:32 PM  GAD 7 : Generalized Anxiety Score  Nervous, Anxious, on Edge '3 1 2 1  '$ Control/stop worrying '3 2 1 1  '$ Worry too much - different things '3 1 2 1  '$ Trouble relaxing '3 1 1 1  '$ Restless 1 0 0 0  Easily annoyed or irritable 3 0 1 1  Afraid - awful might happen '3 1 2 1  '$ Total GAD 7 Score '19 6 9 '$ 6

## 2022-12-26 NOTE — BH Specialist Note (Deleted)
Integrated Behavioral Health via Telemedicine Visit  12/26/2022 Kimberly Rivera [id number]  Number of [place] Clinician visits: 1- Initial Visit  Session Start time: 1209   Session End time: 1250  Total time in minutes: 41   Referring Provider: Gaylan Gerold, CNM Patient/Family location: Home*** [facility] Provider location: [facility] [place] [facility]  All persons participating in visit: Patient Kimberly Rivera and [place] ***  Types of Service: {CHL AMB TYPE OF SERVICE:[phone]}  I connected with Kimberly Rivera and/or Kimberly Rivera's {family members:20773} via  Telephone or Geologist, engineering  (Video is Tree surgeon) and verified that I am speaking with the correct person using two identifiers. Discussed confidentiality: Yes   I discussed the limitations of telemedicine and the availability of in person appointments.  Discussed there is a possibility of technology failure and discussed alternative modes of communication if that failure occurs.  I discussed that engaging in this telemedicine visit, they consent to the provision of behavioral healthcare and the services will be billed under their insurance.  Patient and/or legal guardian expressed understanding and consented to Telemedicine visit: Yes   Presenting Concerns: Patient and/or family reports the following symptoms/concerns: *** Duration of problem: ***; Severity of problem: {Mild/Moderate/Severe:20260}  Patient and/or Family's Strengths/Protective Factors: {CHL AMB BH PROTECTIVE FACTORS:[phone]}  Goals Addressed: Patient will:  Reduce symptoms of: {IBH Symptoms:21014056}   Increase knowledge and/or ability of: {IBH Patient Tools:21014057}   Demonstrate ability to: {IBH Goals:21014053}  Progress towards Goals: {CHL AMB BH PROGRESS TOWARDS GOALS:[phone]}  Interventions: Interventions utilized:  {IBH  Interventions:21014054} Standardized Assessments completed: {IBH Screening Tools:21014051}  Patient and/or Family Response: Patient agrees with treatment plan.   Assessment: Patient currently experiencing ***.   Patient may benefit from continued therapeutic interventions***.  Plan: Follow up with behavioral health clinician on : *** Behavioral recommendations:  -*** -*** Referral(s): {IBH Referrals:21014055}  I discussed the assessment and treatment plan with the patient and/or parent/guardian. They were provided an opportunity to ask questions and all were answered. They agreed with the plan and demonstrated an understanding of the instructions.   They were advised to call back or seek an in-person evaluation if the symptoms worsen or if the condition fails to improve as anticipated.  Garlan Fair, LCSW     12/25/2022   12:14 PM 06/08/2021    1:34 PM 05/04/2021   11:54 AM 04/28/2021    5:32 PM 04/21/2021   10:28 AM  Depression screen PHQ 2/9  Decreased Interest 3 1 1 1 1   Down, Depressed, Hopeless 3 1 1 1 1   PHQ - 2 Score 6 2 2 2 2   Altered sleeping 3 0 1 1 1   Tired, decreased energy 3 1 1 1 1   Change in appetite 3 0 0 0 0  Feeling bad or failure about yourself  3 0 1 1 1   Trouble concentrating 3 0 0 0 1  Moving slowly or fidgety/restless 1 0 0 0 0  Suicidal thoughts 1 0 0 0 0  PHQ-9 Score 23 3 5 5 6       12/25/2022   12:16 PM 06/08/2021    1:34 PM 05/04/2021   11:54 AM 04/28/2021    5:32 PM  GAD 7 : Generalized Anxiety Score  Nervous, Anxious, on Edge 3 1 2 1   Control/stop worrying 3 2 1 1   Worry too much - different things 3 1 2 1   Trouble relaxing 3 1 1  1  Restless 1 0 0 0  Easily annoyed or irritable 3 0 1 1  Afraid - awful might happen 3 1 2 1   Total GAD 7 Score 19 6 9  6

## 2023-01-03 ENCOUNTER — Other Ambulatory Visit: Payer: Self-pay | Admitting: Certified Nurse Midwife

## 2023-01-03 DIAGNOSIS — J0110 Acute frontal sinusitis, unspecified: Secondary | ICD-10-CM

## 2023-01-03 MED ORDER — DOXYCYCLINE HYCLATE 100 MG PO TABS: 100.0000 mg | ORAL_TABLET | Freq: Two times a day (BID) | ORAL | 0 refills | Status: DC

## 2023-01-09 IMAGING — US US MFM OB DETAIL+14 WK
1 series · 13 of 28 positions shown · non-contrast
Comparison: none

[Series 1: us mfm ob detail+14 wk · 13 of 108 slices shown]
[im 4/108]
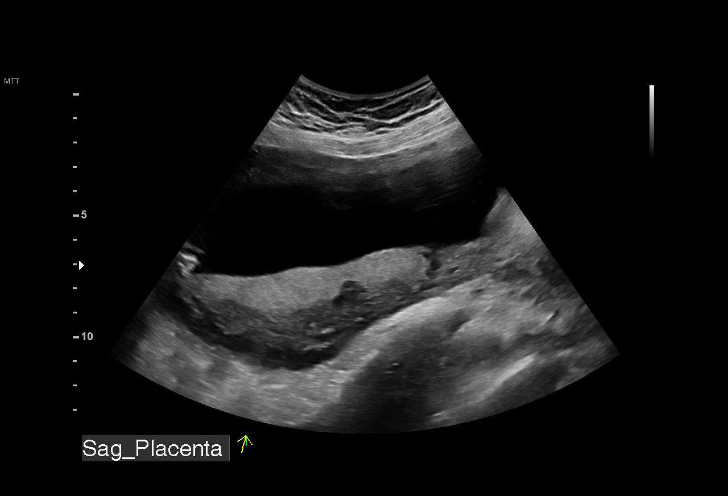
[im 12/108]
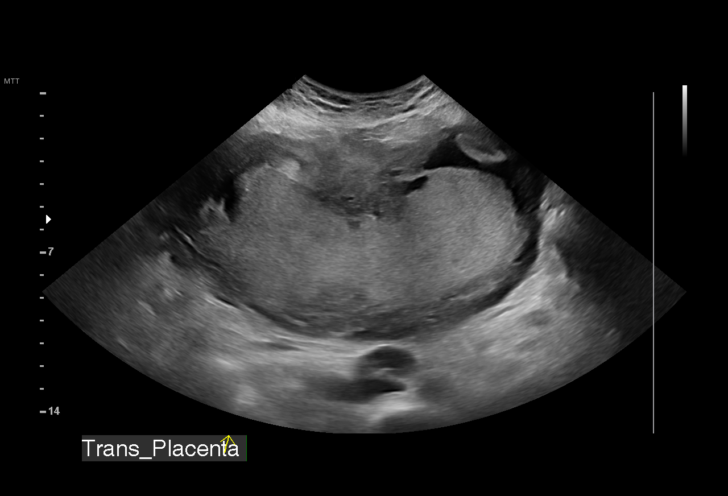
[im 20/108]
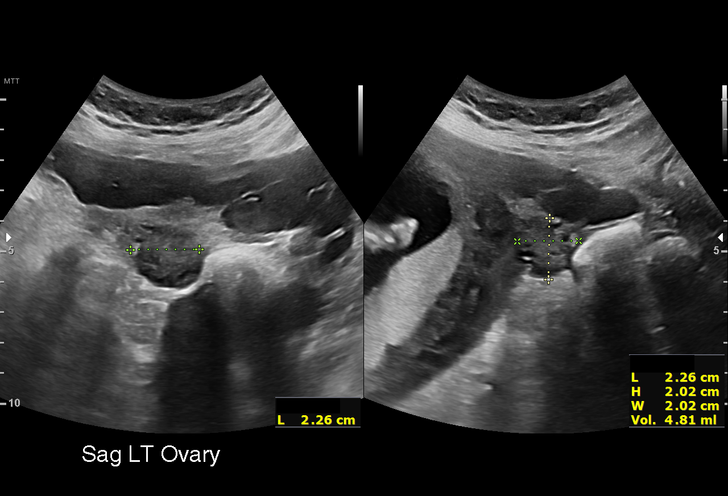
[im 28/108]
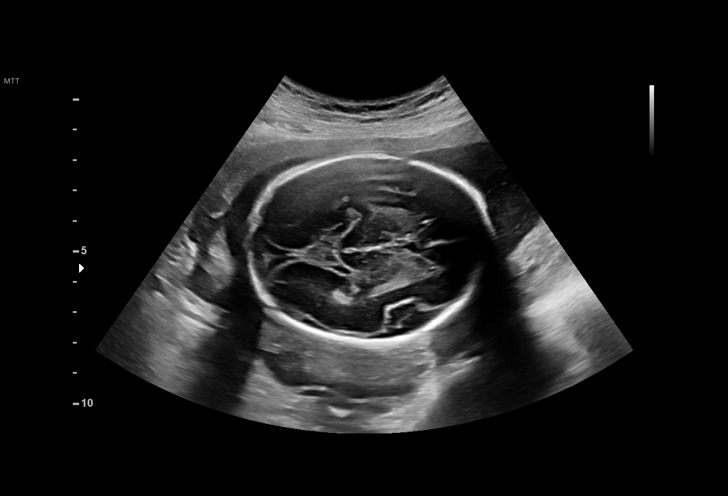
[im 36/108]
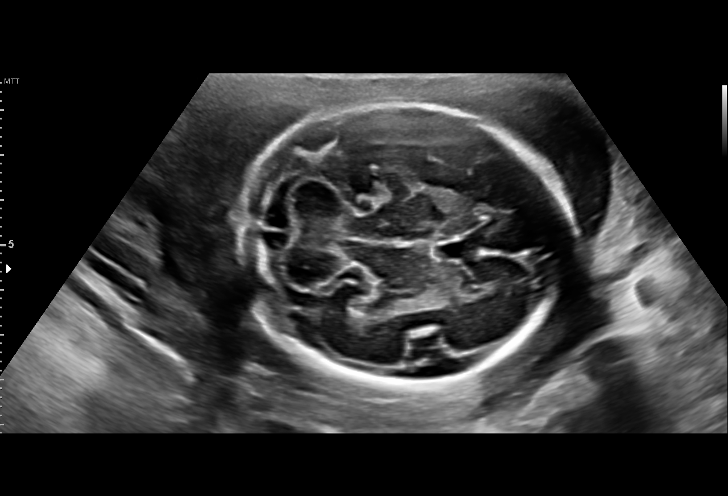
[im 44/108]
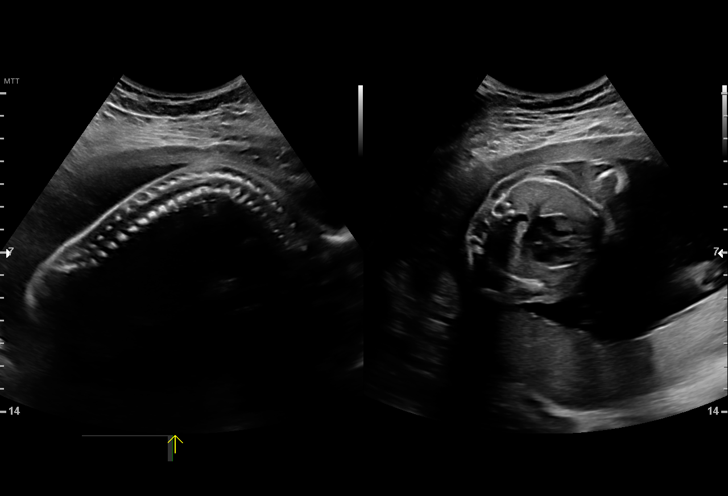
[im 56/108]
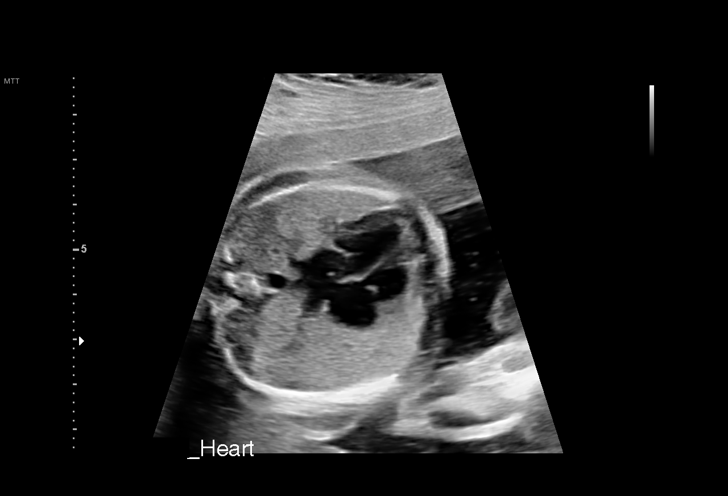
[im 64/108]
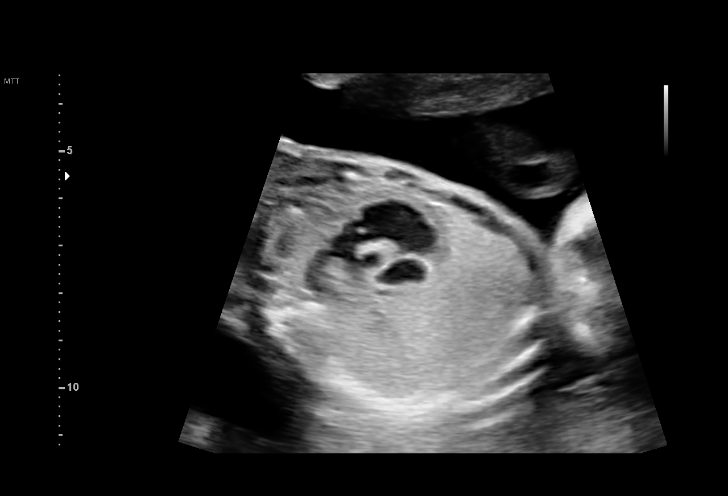
[im 72/108]
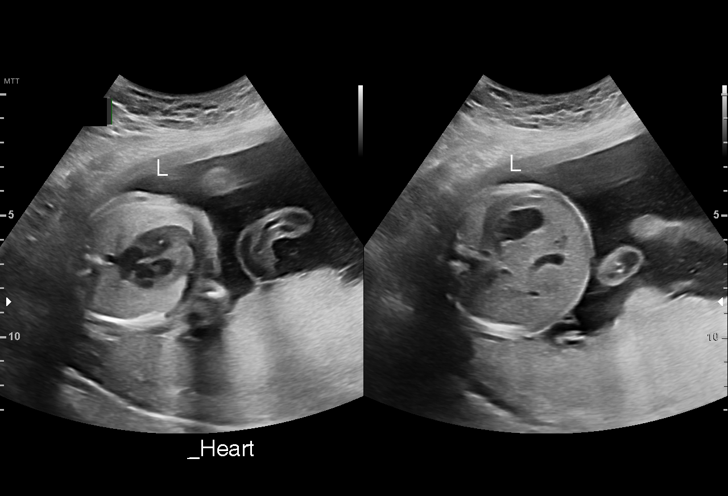
[im 80/108]
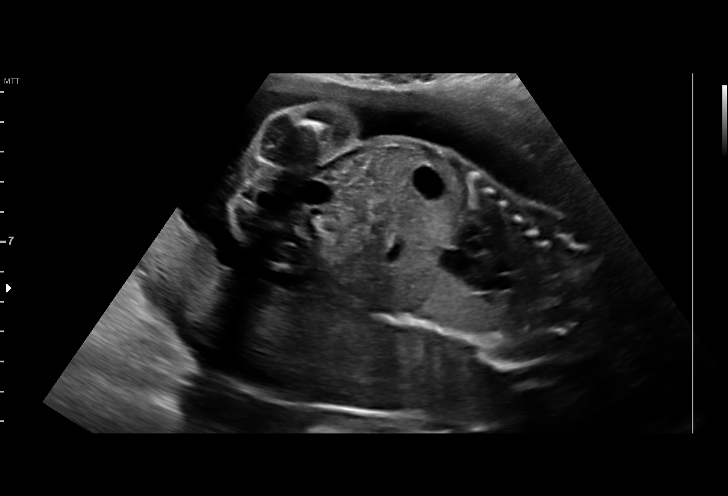
[im 88/108]
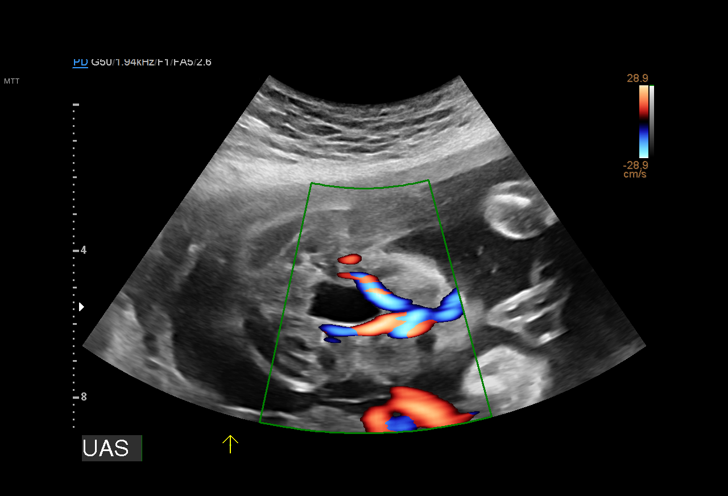
[im 96/108]
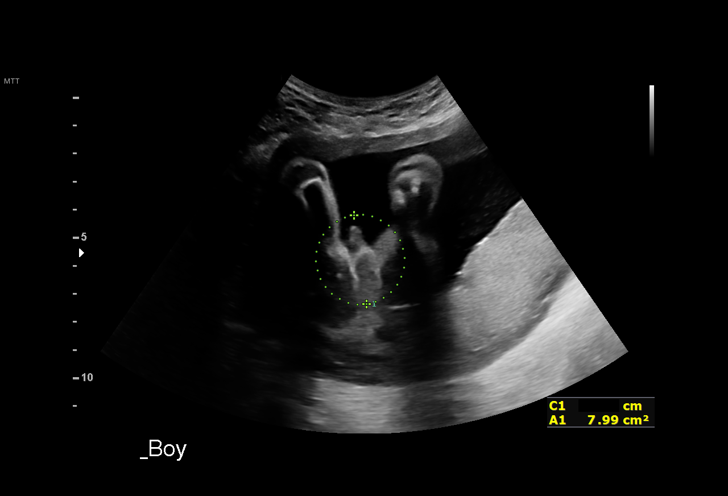
[im 104/108]
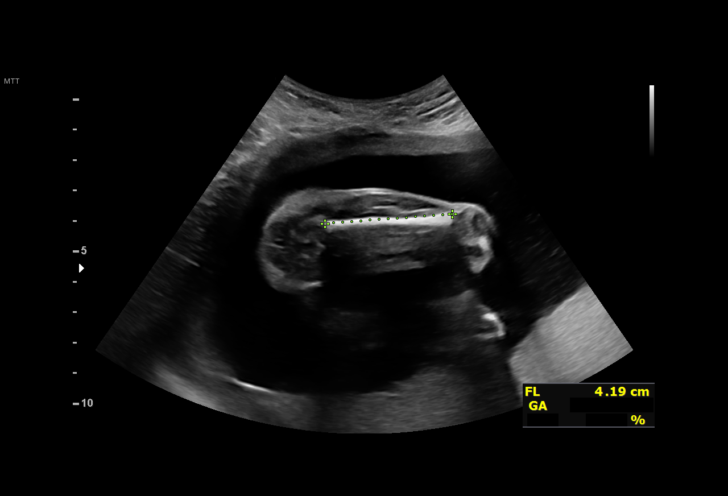

[13 of 28 positions shown; findings below may reference images not displayed]

[REDACTED]care

Indications

 24 weeks gestation of pregnancy
 LR NIPS/ Negative AFP
 Obesity complicating pregnancy, second
 trimester (BMI 30)
 Encounter for antenatal screening for
 malformations
Fetal Evaluation

 Num Of Fetuses:         1
 Fetal Heart Rate(bpm):  138
 Cardiac Activity:       Observed
 Presentation:           Cephalic
 Placenta:               Posterior
 P. Cord Insertion:      Visualized, central

 Amniotic Fluid
 AFI FV:      Within normal limits

                             Largest Pocket(cm)

Biometry
 BPD:      60.9  mm     G. Age:  24w 5d         67  %    CI:        73.56   %    70 - 86
                                                         FL/HC:      18.7   %    18.7 -
 HC:      225.6  mm     G. Age:  24w 4d         49  %    HC/AC:      1.12        1.05 -
 AC:       202   mm     G. Age:  24w 6d         63  %    FL/BPD:     69.3   %    71 - 87
 FL:       42.2  mm     G. Age:  23w 5d         26  %    FL/AC:      20.9   %    20 - 24
 HUM:      39.7  mm     G. Age:  24w 1d         43  %
 CER:      26.2  mm     G. Age:  23w 4d         49  %
 LV:          5  mm
 CM:        5.6  mm
 Est. FW:     693  gm      1 lb 8 oz     53  %
OB History

 Blood Type:   B+
 Gravidity:    3         Term:   2        Prem:   0        SAB:   0
 TOP:          0       Ectopic:  0        Living: 2
Gestational Age

 LMP:           24w 1d        Date:  08/05/20                 EDD:   05/12/21
 U/S Today:     24w 3d                                        EDD:   05/10/21
 Best:          24w 1d     Det. By:  LMP  (08/05/20)          EDD:   05/12/21
Anatomy

 Cranium:               Appears normal         Aortic Arch:            Appears normal
 Cavum:                 Appears normal         Ductal Arch:            Appears normal
 Ventricles:            Appears normal         Diaphragm:              Appears normal
 Choroid Plexus:        Appears normal         Stomach:                Appears normal, left
                                                                       sided
 Cerebellum:            Appears normal         Abdomen:                Appears normal
 Posterior Fossa:       Appears normal         Abdominal Wall:         Appears nml (cord
                                                                       insert, abd wall)
 Nuchal Fold:           Not applicable (>20    Cord Vessels:           Appears normal (3
                        wks GA)                                        vessel cord)
 Face:                  Appears normal         Kidneys:                Appear normal
                        (orbits and profile)
 Lips:                  Appears normal         Bladder:                Appears normal
 Thoracic:              Appears normal         Spine:                  Appears normal
 Heart:                 Appears normal; EIF    Upper Extremities:      Appears normal
 RVOT:                  Appears normal         Lower Extremities:      Appears normal
 LVOT:                  Appears normal

 Other:  Fetus appears to be a male. Nasal bone visualized. Heels/feet and
         open hands/5th digits visualized.
Cervix Uterus Adnexa

 Cervix
 Length:           4.59  cm.
 Normal appearance by transabdominal scan.

 Uterus
 No abnormality visualized.
 Right Ovary
 Within normal limits.

 Left Ovary
 Within normal limits.

 Cul De Sac
 No free fluid seen.

 Adnexa
 No abnormality visualized.
Impression

 Single intrauterine pregnancy here for a detailed anatomy
 due to a transfer of care.
 Normal anatomy with measurements consistent with dates
 There is good fetal movement and amniotic fluid volume
 She has a known echogenic intracardiac focus with also a
 low risk NIPT.

 An echogenic intracardiac focus was observed today. I
 discussed that there is no increased risk to the function or
 structure of the heart. In addition, in the context of a low risk
 NIPS result the risk for aneuploidy are reduced. There were
 no additional markers of aneuploidy observed.  I reviewed
 that an ultrasound is a screening exam and that a diagnostic
 exam via amnioticenetesis is the only test available to provide
 a definitive result.
Recommendations

 Follow up as clinically indicated.

## 2023-01-09 NOTE — BH Specialist Note (Signed)
Integrated Behavioral Health via Telemedicine Visit  01/16/2023 Ashlynd Casola [id number]  Number of [place] Clinician visits: 2- Second Visit  Session Start time: 0820   Session End time: 0848  Total time in minutes: 28   Referring Provider: Gaylan Gerold, CNM Patient/Family location: Home [facility] Provider location: [facility] [place] [facility]  All persons participating in visit: Patient Lauron Offenbacher and [place]   Types of Service: Individual psychotherapy and Video visit  I connected with Genese Stephannie Peters and/or Prisca Danielle Nakama's  n/a  via  Telephone or Video Enabled Telemedicine Application  (Video is Caregility application) and verified that I am speaking with the correct person using two identifiers. Discussed confidentiality: Yes   I discussed the limitations of telemedicine and the availability of in person appointments.  Discussed there is a possibility of technology failure and discussed alternative modes of communication if that failure occurs.  I discussed that engaging in this telemedicine visit, they consent to the provision of behavioral healthcare and the services will be billed under their insurance.  Patient and/or legal guardian expressed understanding and consented to Telemedicine visit: Yes   Presenting Concerns: Patient and/or family reports the following symptoms/concerns: Continued grief with decreased depression and ongoing anxiety; noticing mood improvement when outdoors; looking forward to vacation with family.  Duration of problem: Ongoing; Severity of problem:  moderately severe  Patient and/or Family's Strengths/Protective Factors: Social connections, Concrete supports in place (healthy food, safe environments, etc.), Sense of purpose, and Physical Health (exercise, healthy diet, medication compliance, etc.)  Goals Addressed: Patient will:  Reduce symptoms of: anxiety,  depression, and stress   Increase knowledge and/or ability of: stress reduction   Demonstrate ability to: Increase healthy adjustment to current life circumstances  Progress towards Goals: Ongoing  Interventions: Interventions utilized:  Supportive Reflection Standardized Assessments completed: GAD-7 and PHQ 9  Patient and/or Family Response: Patient agrees with treatment plan.   Assessment: Patient currently experiencing Grief, Major depressive disorder, recurrent, severe without psychotic features; Generalized anxiety disorder.   Patient may benefit from continued therapeutic interventions.  Plan: Follow up with behavioral health clinician on : Two weeks Behavioral recommendations:  -Continue taking Prozac and Wellbutrin as prescribed -Continue using daily self-coping strategies as needed (worry time, outdoor time on good weather days; reading "Boundaries" book until complete) -Continue plan for upcoming vacation with family Referral(s): [place] (In Clinic)  I discussed the assessment and treatment plan with the patient and/or parent/guardian. They were provided an opportunity to ask questions and all were answered. They agreed with the plan and demonstrated an understanding of the instructions.   They were advised to call back or seek an in-person evaluation if the symptoms worsen or if the condition fails to improve as anticipated.  Garlan Fair, LCSW      01/16/2023    8:27 AM 12/25/2022   12:14 PM 06/08/2021    1:34 PM 05/04/2021   11:54 AM 04/28/2021    5:32 PM  Depression screen PHQ 2/9  Decreased Interest 1 3 1 1 1   Down, Depressed, Hopeless 2 3 1 1 1   PHQ - 2 Score 3 6 2 2 2   Altered sleeping 1 3 0 1 1  Tired, decreased energy 1 3 1 1 1   Change in appetite 1 3 0 0 0  Feeling bad or failure about yourself  2 3 0 1 1  Trouble concentrating  3 0 0 0  Moving slowly or fidgety/restless 1 1 0 0 0  Suicidal thoughts 0 1 0 0 0  PHQ-9  Score 9 23 3 5 5       01/16/2023    8:29 AM 12/25/2022   12:16 PM 06/08/2021    1:34 PM 05/04/2021   11:54 AM  GAD 7 : Generalized Anxiety Score  Nervous, Anxious, on Edge 3 3 1 2   Control/stop worrying 3 3 2 1   Worry too much - different things 3 3 1 2   Trouble relaxing 2 3 1 1   Restless 1 1 0 0  Easily annoyed or irritable 1 3 0 1  Afraid - awful might happen 2 3 1 2   Total GAD 7 Score 15 19 6  9

## 2023-01-16 ENCOUNTER — Ambulatory Visit (INDEPENDENT_AMBULATORY_CARE_PROVIDER_SITE_OTHER): Payer: 59 | Admitting: Clinical

## 2023-01-16 DIAGNOSIS — F411 Generalized anxiety disorder: Secondary | ICD-10-CM

## 2023-01-16 DIAGNOSIS — F4321 Adjustment disorder with depressed mood: Secondary | ICD-10-CM

## 2023-01-16 DIAGNOSIS — F332 Major depressive disorder, recurrent severe without psychotic features: Secondary | ICD-10-CM

## 2023-01-16 NOTE — Patient Instructions (Signed)
[facility] [address] [place], [place] [address] [phone] (main office) [phone] (Milee Qualls's office)   

## 2023-01-22 NOTE — BH Specialist Note (Deleted)
Integrated Behavioral Health via Telemedicine Visit  01/22/2023 Britzy Longenecker [id number]  Number of [place] Clinician visits: 2- Second Visit  Session Start time: 0820   Session End time: 0848  Total time in minutes: 28   Referring Provider: *** Patient/Family location: [facility] Provider location: *** All persons participating in visit: *** Types of Service: {CHL AMB TYPE OF [phone]}  I connected with Tyarra Stephannie Peters and/or Jaslynne Danielle Huron's {family members:20773} via  Telephone or Geologist, engineering  (Video is Caregility application) and verified that I am speaking with the correct person using two identifiers. Discussed confidentiality: {YES/NO:21197}  I discussed the limitations of telemedicine and the availability of in person appointments.  Discussed there is a possibility of technology failure and discussed alternative modes of communication if that failure occurs.  I discussed that engaging in this telemedicine visit, they consent to the provision of behavioral healthcare and the services will be billed under their insurance.  Patient and/or legal guardian expressed understanding and consented to Telemedicine visit: {YES/NO:21197}  Presenting Concerns: Patient and/or family reports the following symptoms/concerns: *** Duration of problem: ***; Severity of problem: {Mild/Moderate/Severe:20260}  Patient and/or Family's Strengths/Protective Factors: {CHL AMB BH PROTECTIVE FACTORS:[phone]}  Goals Addressed: Patient will:  Reduce symptoms of: {IBH Symptoms:21014056}   Increase knowledge and/or ability of: {IBH Patient Tools:21014057}   Demonstrate ability to: {IBH Goals:21014053}  Progress towards Goals: {CHL AMB BH PROGRESS TOWARDS GOALS:[phone]}  Interventions: Interventions utilized:  {IBH Interventions:21014054} Standardized Assessments completed: {IBH Screening Tools:21014051}  Patient  and/or Family Response: ***  Assessment: Patient currently experiencing ***.   Patient may benefit from ***.  Plan: Follow up with behavioral health clinician on : *** Behavioral recommendations: *** Referral(s): {IBH Referrals:21014055}  I discussed the assessment and treatment plan with the patient and/or parent/guardian. They were provided an opportunity to ask questions and all were answered. They agreed with the plan and demonstrated an understanding of the instructions.   They were advised to call back or seek an in-person evaluation if the symptoms worsen or if the condition fails to improve as anticipated.  Caroleen Hamman Aleric Froelich, LCSW

## 2023-02-01 ENCOUNTER — Encounter: Payer: Self-pay | Admitting: Emergency Medicine

## 2023-02-01 ENCOUNTER — Ambulatory Visit (INDEPENDENT_AMBULATORY_CARE_PROVIDER_SITE_OTHER): Payer: 59

## 2023-02-01 ENCOUNTER — Ambulatory Visit
Admission: EM | Admit: 2023-02-01 | Discharge: 2023-02-01 | Disposition: A | Discharge status: Home / Self Care | Payer: 59 | Attending: Family Medicine | Admitting: Family Medicine

## 2023-02-01 DIAGNOSIS — M79661 Pain in right lower leg: Secondary | ICD-10-CM

## 2023-02-01 DIAGNOSIS — W19XXXA Unspecified fall, initial encounter: Secondary | ICD-10-CM

## 2023-02-01 MED ORDER — IBUPROFEN 800 MG PO TABS: 800.0000 mg | ORAL_TABLET | Freq: Every day | ORAL | 0 refills | Status: DC | PRN

## 2023-02-01 NOTE — ED Provider Notes (Addendum)
[facility]    CSN: [id number] Arrival date & time: 02/01/23  1947      History   Chief Complaint Chief Complaint  Patient presents with   Fall    HPI Kimberly Rivera is a 32 y.o. female.   HPI Pleasant 32 year old female presents with right lower leg pain secondary to fall at her home earlier today at 5 PM.  Patient reports missing a step while walking down stairs. PMH significant for obesity, anxiety, and asthma.  Past Medical History:  Diagnosis Date   Anxiety    Asthma    Headache    Hx of varicella    Postpartum care following vaginal delivery (8/10) 06/01/2016   Supervision of low-risk pregnancy, third trimester 01/05/2021    Nursing Staff Provider Office Location [facility] Dating  LMP Language  English Anatomy US   Flu Vaccine  October 2021 Genetic/Carrier Screen  NIPS:    AFP:    [place]: TDaP Vaccine  04/13/21 Hgb A1C or  GTT Early  Third trimester  COVID Vaccine x2 needs booster   LAB RESULTS  Rhogam  NA Blood Type  B pos Baby Feeding Plan Breast Antibody   Contraception Family Planning Rubella   Circumcision Yes- IP RPR    Patient Active Problem List   Diagnosis Date Noted   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 Authorizing Provider  buPROPion (WELLBUTRIN XL) 150 MG 24 hr tablet Take 150 mg by mouth daily.   Yes [provider]  doxycycline (VIBRA-TABS) 100 MG tablet Take 1 tablet (100 mg total) by mouth 2 (two) times  daily. 01/03/23   Bernerd Limbo, CNM  FLUoxetine (PROZAC) 40 MG capsule Take 40 mg by mouth daily. 05/05/21  Yes [provider]  ibuprofen (ADVIL) 800 MG tablet Take 1 tablet (800 mg total) by mouth daily as needed. 02/01/23  Yes Trevor Iha, FNP  meclizine (ANTIVERT) 25 MG tablet Take 50 mg by mouth 2 (two) times daily. 12/07/22  Yes [provider]  ondansetron (ZOFRAN-ODT) 8 MG disintegrating tablet Take 1 tablet (8 mg total) by mouth every 8 (eight) hours as needed for nausea or vomiting. 01/05/22  Yes Edd Arbour R, CNM  Prenatal Vit-Fe Fumarate-FA (PRENATAL MULTIVITAMIN) TABS tablet Take 1 tablet by mouth daily at 12 noon.   Yes [provider]    Family History Family History  Problem Relation Age of Onset   Hypertension Mother    Anemia Mother    Hypertension Father    Hypertension Maternal Grandmother    Hypertension Maternal Grandfather    Diabetes Maternal Grandfather    Cancer - Lung Maternal Grandfather    Hypertension Paternal Grandmother  Hypertension Paternal Grandfather    Heart attack Maternal Uncle     Social History Social History   Tobacco Use   Smoking status: Never   Smokeless tobacco: Never  Substance Use Topics   Alcohol use: No    Alcohol/week: 0.0 standard drinks of alcohol   Drug use: No     Allergies   Augmentin [amoxicillin-pot clavulanate] and Codeine   Review of Systems Review of Systems  Musculoskeletal:        Right lower leg pain secondary to fall at home on stairs ~5 pm per patient     Physical Exam Triage Vital Signs ED Triage Vitals [02/01/23 2000]  Enc Vitals Group     BP      Pulse      Resp      Temp      Temp src      SpO2      Weight 245 lb (111.1 kg)     Height 5\' 6"  (1.676 m)     Head Circumference      Peak Flow      Pain Score 8     Pain Loc      Pain Edu?      Excl. in GC?    No data found.  Updated Vital Signs BP 138/84 (BP Location: Right Arm)   Pulse 87   Temp  98.4 F (36.9 C) (Oral)   Resp 16   Ht 5\' 6"  (1.676 m)   Wt 245 lb (111.1 kg)   LMP 01/03/2023   SpO2 94%   Breastfeeding Yes   BMI 39.54 kg/m    Physical Exam Vitals and nursing note reviewed.  Constitutional:      Appearance: Normal appearance. She is obese.  HENT:     Head: Normocephalic and atraumatic.     Mouth/Throat:     Mouth: Mucous membranes are moist.     Pharynx: Oropharynx is clear.  Eyes:     Extraocular Movements: Extraocular movements intact.     Conjunctiva/sclera: Conjunctivae normal.     Pupils: Pupils are equal, round, and reactive to light.  Cardiovascular:     Rate and Rhythm: Normal rate and regular rhythm.     Pulses: Normal pulses.     Heart sounds: Normal heart sounds.  Pulmonary:     Effort: Pulmonary effort is normal.     Breath sounds: Normal breath sounds. No wheezing, rhonchi or rales.  Musculoskeletal:        General: Normal range of motion.     Cervical back: Normal range of motion and neck supple.     Comments: Right lower leg (anterior inferior aspect): TTP over mid tibial shaft with mild soft tissue swelling noted  Skin:    General: Skin is warm and dry.  Neurological:     General: No focal deficit present.     Mental Status: She is alert and oriented to person, place, and time.     Gait: Gait abnormal.  Psychiatric:        Mood and Affect: Mood normal.        Behavior: Behavior normal.      UC Treatments / Results  Labs (all labs ordered are listed, but only abnormal results are displayed) Labs Reviewed - No data to display  EKG   Radiology DG Tibia/Fibula Right  Result Date: 02/01/2023 CLINICAL DATA:  Fall with right lower leg pain. EXAM: RIGHT TIBIA AND FIBULA - 2 VIEW COMPARISON:  None Available. FINDINGS:  There is no evidence of fracture or other focal bone lesions. Soft tissues are unremarkable. IMPRESSION: Negative. Electronically Signed   By: Thornell SartoriusLaura  Taylor M.D.   On: 02/01/2023 20:20    Procedures Procedures  (including critical care time)  Medications Ordered in UC Medications - No data to display  Initial Impression / Assessment and Plan / UC Course  I have reviewed the triage vital signs and the nursing notes.  Pertinent labs & imaging results that were available during my care of the patient were reviewed by me and considered in my medical decision making (see chart for details).     MDM: 1.  Fall, initial encounter-patient reports falling at home at 5 PM this afternoon and reports missing stairs in her home and landing on her right lower leg; 2.  Pain in right lower leg-right tibia/fibula x-ray revealed above. Advised patient of x-ray results with hardcopy and images provided.  Advised patient to RICE affected area for 30 minutes 3 times daily for the next 3 days.  Advised may use Ibuprofen daily or as needed for right lower leg pain. Advised patient if symptoms worsen and/or unresolved please follow-up with your PCP or [facility] provider.  Final Clinical Impressions(s) / UC Diagnoses   Final diagnoses:  Fall, initial encounter  Pain in right lower leg     Discharge Instructions      Advised patient of x-ray results with hardcopy and images provided.  Advised patient to RICE affected area for 30 minutes 3 times daily for the next 3 days.  Advised may use Ibuprofen daily or as needed for right lower leg pain. Advised patient if symptoms worsen and/or unresolved please follow-up with your PCP or [facility] provider.     ED Prescriptions     Medication Sig Dispense Auth. Provider   ibuprofen (ADVIL) 800 MG tablet Take 1 tablet (800 mg total) by mouth daily as needed. 21 tablet Trevor Ihaagan, Tamikka Pilger, FNP      PDMP not reviewed this encounter.   Trevor IhaRagan, Arthuro Canelo, FNP 02/01/23 2032    Trevor Ihaagan, Cotey Rakes, FNP 02/02/23 1050

## 2023-02-01 NOTE — ED Triage Notes (Signed)
Patient states that she was walking down the stairs about 5pm and missed a step.  Patient having right lower leg pain.  Denies any OTC pain meds.

## 2023-02-01 NOTE — Discharge Instructions (Addendum)
Advised patient of x-ray results with hardcopy and images provided.  Advised patient to RICE affected area for 30 minutes 3 times daily for the next 3 days.  Advised may use Ibuprofen daily or as needed for right lower leg pain. Advised patient if symptoms worsen and/or unresolved please follow-up with your PCP or [facility] orthopedic provider.

## 2023-03-08 ENCOUNTER — Telehealth (INDEPENDENT_AMBULATORY_CARE_PROVIDER_SITE_OTHER): Payer: 59 | Admitting: Certified Nurse Midwife

## 2023-03-08 DIAGNOSIS — F3289 Other specified depressive episodes: Secondary | ICD-10-CM | POA: Diagnosis not present

## 2023-03-08 DIAGNOSIS — F4321 Adjustment disorder with depressed mood: Secondary | ICD-10-CM | POA: Diagnosis not present

## 2023-03-08 DIAGNOSIS — F329 Major depressive disorder, single episode, unspecified: Secondary | ICD-10-CM

## 2023-03-08 MED ORDER — FLUOXETINE HCL 40 MG PO CAPS: 40.0000 mg | ORAL_CAPSULE | Freq: Every day | ORAL | 6 refills | Status: DC

## 2023-03-08 MED ORDER — BUPROPION HCL ER (XL) 150 MG PO TB24: 150.0000 mg | ORAL_TABLET | Freq: Every day | ORAL | 6 refills | Status: DC

## 2023-03-08 MED ORDER — FLUOXETINE HCL 20 MG PO CAPS
20.0000 mg | ORAL_CAPSULE | Freq: Every day | ORAL | 3 refills | Status: DC
Start: 2023-03-08 — End: 2024-01-09
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
## 2023-08-07 ENCOUNTER — Encounter (HOSPITAL_COMMUNITY): Payer: Self-pay | Admitting: *Deleted

## 2023-08-07 ENCOUNTER — Inpatient Hospital Stay (HOSPITAL_COMMUNITY)
Admission: AD | Admit: 2023-08-07 | Discharge: 2023-08-08 | Disposition: A | Discharge status: Home / Self Care | Payer: Managed Care, Other (non HMO) | Attending: Obstetrics and Gynecology | Admitting: Obstetrics and Gynecology

## 2023-08-07 DIAGNOSIS — Z3202 Encounter for pregnancy test, result negative: Secondary | ICD-10-CM | POA: Diagnosis not present

## 2023-08-07 DIAGNOSIS — N939 Abnormal uterine and vaginal bleeding, unspecified: Secondary | ICD-10-CM | POA: Insufficient documentation

## 2023-08-07 DIAGNOSIS — O26851 Spotting complicating pregnancy, first trimester: Secondary | ICD-10-CM | POA: Diagnosis present

## 2023-08-07 DIAGNOSIS — R102 Pelvic and perineal pain: Secondary | ICD-10-CM | POA: Diagnosis present

## 2023-08-07 LAB — URINALYSIS, ROUTINE W REFLEX MICROSCOPIC
Bilirubin Urine: NEGATIVE
Glucose, UA: NEGATIVE mg/dL
Ketones, ur: NEGATIVE mg/dL
Leukocytes,Ua: NEGATIVE
Nitrite: NEGATIVE
Protein, ur: NEGATIVE mg/dL
Specific Gravity, Urine: 1.008 (ref 1.005–1.030)
pH: 6 (ref 5.0–8.0)

## 2023-08-07 LAB — CBC
HCT: 41.3 % (ref 36.0–46.0)
Hemoglobin: 13.9 g/dL (ref 12.0–15.0)
MCH: 29.3 pg (ref 26.0–34.0)
MCHC: 33.7 g/dL (ref 30.0–36.0)
MCV: 86.9 fL (ref 80.0–100.0)
Platelets: 409 10*3/uL — ABNORMAL HIGH (ref 150–400)
RBC: 4.75 MIL/uL (ref 3.87–5.11)
RDW: 12.4 % (ref 11.5–15.5)
WBC: 9.2 10*3/uL (ref 4.0–10.5)
nRBC: 0 % (ref 0.0–0.2)

## 2023-08-07 LAB — HCG, QUANTITATIVE, PREGNANCY: hCG, Beta Chain, Quant, S: <1 m[IU]/mL (ref <5)

## 2023-08-07 LAB — POCT PREGNANCY, URINE: Preg Test, Ur: NEGATIVE

## 2023-08-07 NOTE — MAU Note (Signed)
.  Kimberly Rivera is a 32 y.o. at Unknown here in MAU reporting spotting starting Friday. Has been from spotting to heavy bleeding with clots. Used 2-3 pads today. Having severe cramping. Has not taken any meds LMP: 8/3 but had spotting 9/6 Onset of complaint: last Friday Pain score: 5 Vitals:   08/07/23 2014 08/07/23 2016  BP: (!) 144/71 (!) 141/81  Pulse:    Resp:    Temp:    SpO2:       FHT:n/a Lab orders placed from triage:  upt and u/a

## 2023-08-08 DIAGNOSIS — N939 Abnormal uterine and vaginal bleeding, unspecified: Secondary | ICD-10-CM | POA: Diagnosis not present

## 2023-08-08 DIAGNOSIS — Z3202 Encounter for pregnancy test, result negative: Secondary | ICD-10-CM | POA: Diagnosis not present

## 2023-08-08 MED ORDER — IBUPROFEN 600 MG PO TABS: 600.0000 mg | ORAL_TABLET | Freq: Four times a day (QID) | ORAL | 0 refills | Status: DC | PRN

## 2023-08-08 NOTE — MAU Provider Note (Signed)
Chief Complaint:  Vaginal Bleeding   None    HPI: Kimberly Rivera is a 32 y.o. [phone] who presents to maternity admissions reporting bleeding and cramping  Started out light and progressed to heavier. .states had a faintly positive pregnancy test a week or two ago.  She reports vaginal bleeding, denies vaginal itching/burning, urinary symptoms, h/a, dizziness, n/v, or fever/chills.    Vaginal Bleeding The patient's primary symptoms include pelvic pain and vaginal bleeding. The problem has been unchanged. Pertinent negatives include no back pain, chills or fever. The vaginal discharge was bloody. She has been passing clots. She has not been passing tissue. Nothing aggravates the symptoms. She has tried nothing for the symptoms.   RN Note: .Kimberly Rivera is a 32 y.o. at Unknown here in MAU reporting spotting starting Friday. Has been from spotting to heavy bleeding with clots. Used 2-3 pads today. Having severe cramping. Has not taken any meds LMP: 8/3 but had spotting 9/6 Onset of complaint: last Friday Pain score: 5  Past Medical History: Past Medical History:  Diagnosis Date   Anxiety    Asthma    Headache    Hx of varicella    Postpartum care following vaginal delivery (8/10) 06/01/2016   Supervision of low-risk pregnancy, third trimester 01/05/2021    Nursing Staff Provider Office Location [facility] Dating  LMP Language  English Anatomy US   Flu Vaccine  October 2021 Genetic/Carrier Screen  NIPS:    AFP:    Horizon: TDaP Vaccine  04/13/21 Hgb A1C or  GTT Early  Third trimester  COVID Vaccine x2 needs booster   LAB RESULTS  Rhogam  NA Blood Type  B pos Baby Feeding Plan Breast Antibody   Contraception Family Planning Rubella   Circumcision Yes- IP RPR    Past obstetric history: OB History  Gravida Para Term Preterm AB Living  4 3 3  0 0 3  SAB IAB Ectopic Multiple Live Births  0 0 0 0 2    # Outcome Date GA Lbr Len/2nd Weight Sex Type Anes PTL Lv  4 Current           3 Term  05/11/21 [redacted]w[redacted]d 05:32 / 00:15 3655 g M Vag-Spont None  LIV  2 Term 06/01/16 [redacted]w[redacted]d 03:50 / 00:28 3320 g F Vag-Spont EPI  LIV  1 Term 11/15/12 [redacted]w[redacted]d   M Vag-Spont       Past Surgical History: Past Surgical History:  Procedure Laterality Date   KNEE SURGERY Right    TONSILLECTOMY     UMBILICAL HERNIA REPAIR      Family History: Family History  Problem Relation Age of Onset   Hypertension Mother    Anemia Mother    Hypertension Father    Hypertension Maternal Grandmother    Hypertension Maternal Grandfather    Diabetes Maternal Grandfather    Cancer - Lung Maternal Grandfather    Hypertension Paternal Grandmother    Hypertension Paternal Grandfather    Heart attack Maternal Uncle     Social History: Social History   Tobacco Use   Smoking status: Never   Smokeless tobacco: Never  Substance Use Topics   Alcohol use: No    Alcohol/week: 0.0 standard drinks of alcohol   Drug use: No    Allergies:  Allergies  Allergen Reactions   Augmentin [Amoxicillin-Pot Clavulanate] Hives   Codeine Hives    Meds:  No medications prior to admission.    I have reviewed patient's Past Medical Hx, Surgical Hx,  Family Hx, Social Hx, medications and allergies.  ROS:  Review of Systems  Constitutional:  Negative for chills and fever.  Genitourinary:  Positive for pelvic pain and vaginal bleeding.  Musculoskeletal:  Negative for back pain.   Other systems negative     Physical Exam  Patient Vitals for the past 24 hrs:  BP Temp Pulse Resp SpO2 Height Weight  08/08/23 0101 130/76 -- -- -- -- -- --  08/07/23 2016 (!) 141/81 -- -- -- -- -- --  08/07/23 2014 (!) 144/71 -- -- -- -- -- --  08/07/23 2010 -- 98.1 F (36.7 C) 90 17 100 % 5\' 6"  (1.676 m) 108 kg   Constitutional: Well-developed, well-nourished female in no acute distress.  Cardiovascular: normal rate Respiratory: normal effort, no distress. GI: Abd soft, non-tender.  Nondistended.  No rebound, No guarding.  MS:  Extremities nontender, no edema, normal ROM Neurologic: Alert and oriented x 4.   Grossly nonfocal. GU: Neg CVAT. Skin:  Warm and Dry Psych:  Affect appropriate.  PELVIC EXAM: deferred    Labs: Results for orders placed or performed during the hospital encounter of 08/07/23 (from the past 24 hour(s))  Urinalysis, Routine w reflex microscopic -Urine, Clean Catch     Status: Abnormal   Collection Time: 08/07/23  8:29 PM  Result Value Ref Range   Color, Urine YELLOW YELLOW   APPearance CLEAR CLEAR   Specific Gravity, Urine 1.008 1.005 - 1.030   pH 6.0 5.0 - 8.0   Glucose, UA NEGATIVE NEGATIVE mg/dL   Hgb urine dipstick LARGE (A) NEGATIVE   Bilirubin Urine NEGATIVE NEGATIVE   Ketones, ur NEGATIVE NEGATIVE mg/dL   Protein, ur NEGATIVE NEGATIVE mg/dL   Nitrite NEGATIVE NEGATIVE   Leukocytes,Ua NEGATIVE NEGATIVE   RBC / HPF 0-5 0 - 5 RBC/hpf   WBC, UA 0-5 0 - 5 WBC/hpf   Bacteria, UA RARE (A) NONE SEEN   Squamous Epithelial / HPF 0-5 0 - 5 /HPF  Pregnancy, urine POC     Status: None   Collection Time: 08/07/23  8:44 PM  Result Value Ref Range   Preg Test, Ur NEGATIVE NEGATIVE  CBC     Status: Abnormal   Collection Time: 08/07/23  8:52 PM  Result Value Ref Range   WBC 9.2 4.0 - 10.5 K/uL   RBC 4.75 3.87 - 5.11 MIL/uL   Hemoglobin 13.9 12.0 - 15.0 g/dL   HCT 56.2 13.0 - 86.5 %   MCV 86.9 80.0 - 100.0 fL   MCH 29.3 26.0 - 34.0 pg   MCHC 33.7 30.0 - 36.0 g/dL   RDW 78.4 69.6 - 29.5 %   Platelets 409 (H) 150 - 400 K/uL   nRBC 0.0 0.0 - 0.2 %  hCG, quantitative, pregnancy     Status: None   Collection Time: 08/07/23  8:52 PM  Result Value Ref Range   hCG, Beta Chain, Quant, S <1 <5 mIU/mL      Imaging:  No results found.  MAU Course/MDM: I have reviewed the triage vital signs and the nursing notes.   Pertinent labs & imaging results that were available during my care of the patient were reviewed by me and considered in my medical decision making (see chart for details).       I have reviewed her medical records including past results, notes and treatments.   I have ordered labs as follows:  HCG was negative. UA is clear.  CBC normal with excellent hemoglobin Imaging ordered:  none Results reviewed.    Treatments in MAU included none  Discussed bleeding may reflect a miscarriage or possibly just menses.  Reviewed waiting for bleeding to stop before trying to get pregnant again.  .   Pt stable at time of discharge.  Assessment: 1. Abnormal uterine bleeding   2. Pregnancy test negative     Plan: Discharge home Recommend followup in office  Rx sent for ibuprofen for cramping  Follow-up Information     [facility] Follow up.   Specialty: Obstetrics and Gynecology Contact information: [address] [place] [address] [phone]                Encouraged to return here or to other Urgent Care/ED if she develops worsening of symptoms, increase in pain, fever, or other concerning symptoms.   Wynelle Bourgeois CNM, MSN Certified Nurse-Midwife 08/08/2023 1:43 AM

## 2023-10-31 ENCOUNTER — Other Ambulatory Visit: Payer: Self-pay

## 2023-10-31 DIAGNOSIS — F4321 Adjustment disorder with depressed mood: Secondary | ICD-10-CM

## 2023-10-31 DIAGNOSIS — F329 Major depressive disorder, single episode, unspecified: Secondary | ICD-10-CM

## 2023-11-02 MED ORDER — BUPROPION HCL ER (XL) 150 MG PO TB24: 150.0000 mg | ORAL_TABLET | Freq: Every day | ORAL | 0 refills | Status: DC

## 2023-12-05 ENCOUNTER — Ambulatory Visit: Payer: Managed Care, Other (non HMO) | Admitting: Certified Nurse Midwife

## 2024-01-02 ENCOUNTER — Ambulatory Visit: Payer: Managed Care, Other (non HMO) | Admitting: Certified Nurse Midwife

## 2024-01-09 ENCOUNTER — Ambulatory Visit (INDEPENDENT_AMBULATORY_CARE_PROVIDER_SITE_OTHER): Admitting: Certified Nurse Midwife

## 2024-01-09 ENCOUNTER — Other Ambulatory Visit: Payer: Self-pay

## 2024-01-09 ENCOUNTER — Encounter: Payer: Self-pay | Admitting: Certified Nurse Midwife

## 2024-01-09 ENCOUNTER — Other Ambulatory Visit (HOSPITAL_COMMUNITY)
Admission: RE | Admit: 2024-01-09 | Discharge: 2024-01-09 | Disposition: A | Discharge status: Home / Self Care | Source: Ambulatory Visit | Attending: Certified Nurse Midwife | Admitting: Certified Nurse Midwife

## 2024-01-09 VITALS — BP 126/79 | HR 94 | Wt 240.0 lb

## 2024-01-09 DIAGNOSIS — Z113 Encounter for screening for infections with a predominantly sexual mode of transmission: Secondary | ICD-10-CM | POA: Insufficient documentation

## 2024-01-09 DIAGNOSIS — Z124 Encounter for screening for malignant neoplasm of cervix: Secondary | ICD-10-CM | POA: Insufficient documentation

## 2024-01-09 DIAGNOSIS — Z1151 Encounter for screening for human papillomavirus (HPV): Secondary | ICD-10-CM | POA: Insufficient documentation

## 2024-01-09 DIAGNOSIS — Z01419 Encounter for gynecological examination (general) (routine) without abnormal findings: Secondary | ICD-10-CM

## 2024-01-09 DIAGNOSIS — F4321 Adjustment disorder with depressed mood: Secondary | ICD-10-CM | POA: Diagnosis not present

## 2024-01-09 DIAGNOSIS — F329 Major depressive disorder, single episode, unspecified: Secondary | ICD-10-CM | POA: Diagnosis not present

## 2024-01-09 MED ORDER — FLUOXETINE HCL 40 MG PO CAPS: 40.0000 mg | ORAL_CAPSULE | Freq: Every day | ORAL | 6 refills | Status: AC

## 2024-01-09 MED ORDER — BUPROPION HCL ER (XL) 150 MG PO TB24: 150.0000 mg | ORAL_TABLET | Freq: Every day | ORAL | 6 refills | Status: AC

## 2024-01-09 NOTE — Progress Notes (Unsigned)
 ANNUAL EXAM Patient name: Kimberly Rivera MRN [id number]  Date of birth: [birth year] Chief Complaint:   Gynecologic Exam  History of Present Illness:   Kimberly Rivera is a 33 y.o. [phone] African-American female being seen today for a routine annual exam.  Current complaints: None, wants STI testing today. Also needs refills on her anxiety meds. Has been stable but is currently experiencing greatly increased marital stress.  Patient's last menstrual period was 01/05/2024 (exact date).   Upstream - 01/10/24 1645       Pregnancy Intention Screening   Does the patient want to become pregnant in the next year? No    Does the patient's partner want to become pregnant in the next year? N/A    Would the patient like to discuss contraceptive options today? Yes      Contraception Wrap Up   Current Method Withdrawal or Other Method    End Method Female Condom    Contraception Counseling Provided Yes    How was the end contraceptive method provided? Provided on site            The pregnancy intention screening data noted above was reviewed. Potential methods of contraception were discussed. The patient elected to proceed with Female Condom.   Last pap 10/14/2020. Results were:  normal . H/O abnormal pap: no Last mammogram: N/A (age). Results were: N/A. Family h/o breast cancer: no Last colonoscopy: N/A (age). Results were: N/A. Family h/o colorectal cancer: no     01/16/2023    8:27 AM 12/25/2022   12:14 PM 06/08/2021    1:34 PM 05/04/2021   11:54 AM 04/28/2021    5:32 PM  Depression screen PHQ 2/9  Decreased Interest 1 3 1 1 1   Down, Depressed, Hopeless 2 3 1 1 1   PHQ - 2 Score 3 6 2 2 2   Altered sleeping 1 3 0 1 1  Tired, decreased energy 1 3 1 1 1   Change in appetite 1 3 0 0 0  Feeling bad or failure about yourself  2 3 0 1 1  Trouble concentrating  3 0 0 0  Moving slowly or fidgety/restless 1 1 0 0 0  Suicidal thoughts 0 1 0 0 0  PHQ-9 Score 9 23 3 5 5          01/16/2023    8:29 AM 12/25/2022   12:16 PM 06/08/2021    1:34 PM 05/04/2021   11:54 AM  GAD 7 : Generalized Anxiety Score  Nervous, Anxious, on Edge 3 3 1 2   Control/stop worrying 3 3 2 1   Worry too much - different things 3 3 1 2   Trouble relaxing 2 3 1 1   Restless 1 1 0 0  Easily annoyed or irritable 1 3 0 1  Afraid - awful might happen 2 3 1 2   Total GAD 7 Score 15 19 6 9      Review of Systems:   Pertinent items are noted in HPI Denies any headaches, blurred vision, fatigue, shortness of breath, chest pain, abdominal pain, abnormal vaginal discharge/itching/odor/irritation, problems with periods, bowel movements, urination, or intercourse unless otherwise stated above.  Pertinent History Reviewed:  Reviewed past medical,surgical, social and family history.  Reviewed problem list, medications and allergies.  Physical Assessment:   Vitals:   01/09/24 1328 01/09/24 1345  BP: 138/89 126/79  Pulse: 86 94  Weight: 240 lb (108.9 kg)    Body mass index is 38.74 kg/m.   Physical Examination:  General appearance -  well appearing, and in no distress Mental status - alert, oriented to person, place, and time Psych:  She has a normal mood and affect Skin - warm and dry, normal color, no suspicious lesions noted Chest - effort normal, no problems with respiration noted Heart - normal rate and regular rhythm Neck:  midline trachea, no thyromegaly or nodules Breasts - breasts appear normal, no suspicious masses, no skin or nipple changes or  axillary nodes Abdomen - soft, nontender, nondistended, no masses or organomegaly Pelvic - VULVA: normal appearing vulva with no masses, tenderness or lesions   VAGINA: normal appearing vagina with normal color and discharge, no lesions   CERVIX: normal appearing cervix without discharge or lesions, no CMT Thin prep pap is done with HR HPV cotesting Extremities:  No swelling or varicosities noted  Chaperone present for exam  No results found  for this or any previous visit (from the past 24 hours).  Assessment & Plan:  1. Encounter for annual routine gynecological examination (Primary) - Cytology - PAP - Will follow up results of pap smear and manage accordingly.  2. Routine screening for STI (sexually transmitted infection) - HIV antibody (with reflex) - Hepatitis B surface antigen - Hepatitis C antibody - RPR  3. Reactive depression - buPROPion (WELLBUTRIN XL) 150 MG 24 hr tablet; Take 1 tablet (150 mg total) by mouth daily.  Dispense: 30 tablet; Refill: 6 - FLUoxetine (PROZAC) 40 MG capsule; Take 1 capsule (40 mg total) by mouth daily.  Dispense: 30 capsule; Refill: 6  4. Complicated grief - buPROPion (WELLBUTRIN XL) 150 MG 24 hr tablet; Take 1 tablet (150 mg total) by mouth daily.  Dispense: 30 tablet; Refill: 6 - FLUoxetine (PROZAC) 40 MG capsule; Take 1 capsule (40 mg total) by mouth daily.  Dispense: 30 capsule; Refill: 6   Mammogram: @ 32yo, or sooner if problems Colonoscopy: @ 32yo, or sooner if problems  Orders Placed This Encounter  Procedures   HIV antibody (with reflex)   Hepatitis B surface antigen   Hepatitis C antibody   RPR   Meds:  Meds ordered this encounter  Medications   buPROPion (WELLBUTRIN XL) 150 MG 24 hr tablet    Sig: Take 1 tablet (150 mg total) by mouth daily.    Dispense:  30 tablet    Refill:  6   FLUoxetine (PROZAC) 40 MG capsule    Sig: Take 1 capsule (40 mg total) by mouth daily.    Dispense:  30 capsule    Refill:  6   Follow-up: Return as needed, for ANN.  Bernerd Limbo, CNM 01/10/2024 4:45 PM

## 2024-01-10 LAB — HIV ANTIBODY (ROUTINE TESTING W REFLEX): HIV Screen 4th Generation wRfx: NONREACTIVE

## 2024-01-10 LAB — HEPATITIS B SURFACE ANTIGEN: Hepatitis B Surface Ag: NEGATIVE

## 2024-01-10 LAB — RPR: RPR Ser Ql: NONREACTIVE

## 2024-01-10 LAB — HEPATITIS C ANTIBODY: Hep C Virus Ab: NONREACTIVE

## 2024-01-11 LAB — CYTOLOGY - PAP
Chlamydia: NEGATIVE
Comment: NEGATIVE
Comment: NEGATIVE
Comment: NEGATIVE
Comment: NORMAL
Diagnosis: NEGATIVE
High risk HPV: NEGATIVE
Neisseria Gonorrhea: NEGATIVE
Trichomonas: NEGATIVE

## 2024-02-06 ENCOUNTER — Encounter: Payer: Self-pay | Admitting: Certified Nurse Midwife

## 2024-02-06 ENCOUNTER — Ambulatory Visit: Admitting: Certified Nurse Midwife

## 2024-02-06 NOTE — Progress Notes (Signed)
 Pt had three positive pregnancy tests on Friday, April 11 with LMP of 01/05/24. Was nauseated with tender breasts until Saturday then began feeling better. Pink spotting with cramping began yesterday on 02/05/24 with passage of tissue and heavier bleeding today. Did have unprotected sex around ovulation, this is likely a blighted ovum. Pt given bleeding precautions, will invite into care for birth control discussion.  Lemuel Quaker, CNM, MSN, IBCLC Certified Nurse Midwife, [facility]

## 2024-04-19 NOTE — Progress Notes (Signed)
 Impression   1. Routine screening for STI (sexually transmitted infection)   2. Possible exposure to STI      Plan     Patient's Medications       * Accurate as of April 19, 2024 11:59 PM. Reflects encounter med changes as of last refresh          Continued Medications      Instructions  famotidine  20 mg tablet Commonly known as: PEPCID   20 mg, Oral   fluoxetine  40 MG capsule Commonly known as: PROZAC   60 mg, Oral, Daily   fluticasone propionate 50 mcg/actuation nasal spray Commonly known as: FLONASE  1 spray, Nasal, 2 times a day   loratadine 10 MG tablet Commonly known as: CLARITIN  10 mg, Oral, Daily   * moxifloxacin 0.5% ophthalmic solution Commonly known as: VIGAMOX  1 drop, Ophthalmic, 3 times a day   * moxifloxacin 0.5% ophthalmic solution Commonly known as: VIGAMOX  1 drop, Both Eyes, 3 times a day   PROBIOTIC PRODUCT PO  Oral      * * This list has 2 medication(s) that are the same as other medications prescribed for you. Read the directions carefully, and ask your doctor or other care provider to review them with you.           Orders Placed This Encounter  Procedures  . BV, Candida, TV, CT, & GC, NAA vag swab Vaginal Genital  . Syphilis: RPR With Reflex to RPR Titer and Treponemal Ab  . HIV-1/O/2, 4th Generation  . POCT urine pregnancy test  . POC Urine Dipstick   Urine dip unremarkable, UPT negative Will send out NuSwab for STI testing along with syphilis and HIV serology, and will notify via MyChart if any treatment is needed Discussed with concern for recent exposure, recommend retesting in a few months. Advised to return with any new symptoms.  Risks, benefits, and alternatives of the medications and treatment plan prescribed today were discussed, and patient expressed understanding. Plan follow-up as discussed or as needed if any worsening symptoms or change in condition.     Antionette FORBES [place], PA-C     Subjective    Patient ID:  Kimberly Rivera is a 33 y.o. (DOB [birth year]) female.     Patient presents with  . Possible Sexually Transmitted Disease    No sx  Poss exposure by partner  Pt is breastfeeding      HPI:  Patient presents primarily for STI testing.  She is not currently having any symptoms.  She recently found out that her husband has had a new partner, prompting evaluation today.  No specific known exposures.  Denies any fever, abdominal pain, back pain, vaginal irritation or discharge, urinary symptoms.  She is breast-feeding 18-year-old infant at home.  Past Medical History, Past Surgery History, Allergies, Social History, and Family History were reviewed and updated.    ROS See HPI for pertinent positives and negatives   Objective   BP 128/79   Pulse 80   Temp 98.4 F (36.9 C)   Resp 18   Ht 5' 6 (1.676 m)   Wt 240 lb (108.9 kg)   LMP 04/07/2024   SpO2 97%   Breastfeeding Yes   BMI 38.74 kg/m  Physical Exam Constitutional:      General: She is not in acute distress.    Appearance: Normal appearance. She is not ill-appearing, toxic-appearing or diaphoretic.  Cardiovascular:     Pulses: Normal pulses.  Pulmonary:  Effort: Pulmonary effort is normal.  Abdominal:     General: Abdomen is flat.     Palpations: Abdomen is soft.  Skin:    General: Skin is warm and dry.     Capillary Refill: Capillary refill takes less than 2 seconds.  Neurological:     Mental Status: She is alert and oriented to person, place, and time.      POCT urine pregnancy test [[id number]] (Normal)  Resulted: 04/19/24 1828, Result status: Final result   Resulted by: gc Resulting lab: [facility]    Components    Component Value Reference Range Flag  HCG Negative Negative --  [facility] Internal Control Acceptable -- --           POC Urine Dipstick [[id number]] (Abnormal)  Resulted: 04/19/24 1828, Result status: Final result   Resulted by: gc Resulting lab: [facility]    Components    Component Value Reference Range Flag  Color Yellow Yellow --  Clarity Clear Clear --  Glucose,Urine Negative Negative mg/dL --  Bilirubin Negative Negative --  Ketones,Urine Negative Negative mg/dL --  Specific Gravity 8.989 1.005, 1.010, 1.015, 1.020, 1.025, 1.030 --  Blood Negative Negative --  pH 8.0 -- --  Protein +/- Negative mg/dL A*  Urobilinogen 0.2 0.2, 1.0 EU/dL --  Nitrite Negative Negative --  Leukocyte Esterase Negative Negative --            *Some images could not be shown.
